# Patient Record
Sex: Female | Born: 1980
Health system: Southern US, Community
[De-identification: ages and names within clinical notes are randomized; demographics above are authoritative.]

## PROBLEM LIST (undated history)

## (undated) ENCOUNTER — Inpatient Hospital Stay (HOSPITAL_COMMUNITY): Payer: Self-pay

## (undated) DIAGNOSIS — D649 Anemia, unspecified: Secondary | ICD-10-CM

## (undated) DIAGNOSIS — R112 Nausea with vomiting, unspecified: Secondary | ICD-10-CM

## (undated) DIAGNOSIS — I499 Cardiac arrhythmia, unspecified: Secondary | ICD-10-CM

## (undated) DIAGNOSIS — T8859XA Other complications of anesthesia, initial encounter: Secondary | ICD-10-CM

## (undated) DIAGNOSIS — O26899 Other specified pregnancy related conditions, unspecified trimester: Secondary | ICD-10-CM

## (undated) DIAGNOSIS — Z789 Other specified health status: Secondary | ICD-10-CM

## (undated) DIAGNOSIS — Z8619 Personal history of other infectious and parasitic diseases: Secondary | ICD-10-CM

## (undated) DIAGNOSIS — T4145XA Adverse effect of unspecified anesthetic, initial encounter: Secondary | ICD-10-CM

## (undated) DIAGNOSIS — Z9889 Other specified postprocedural states: Secondary | ICD-10-CM

## (undated) DIAGNOSIS — M4310 Spondylolisthesis, site unspecified: Secondary | ICD-10-CM

## (undated) DIAGNOSIS — R12 Heartburn: Secondary | ICD-10-CM

## (undated) HISTORY — PX: WISDOM TOOTH EXTRACTION: SHX21

---
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
## 2010-06-28 LAB — RUBELLA ANTIBODY, IGM: Rubella: IMMUNE

## 2010-06-28 LAB — ABO/RH

## 2010-06-28 LAB — RPR: RPR: NONREACTIVE

## 2010-06-28 LAB — HEPATITIS B SURFACE ANTIGEN: Hepatitis B Surface Ag: NEGATIVE

## 2010-10-04 ENCOUNTER — Other Ambulatory Visit: Payer: Self-pay | Admitting: Obstetrics & Gynecology

## 2010-10-05 ENCOUNTER — Encounter (HOSPITAL_COMMUNITY): Payer: Self-pay

## 2010-10-09 ENCOUNTER — Encounter (HOSPITAL_COMMUNITY)
Admission: RE | Admit: 2010-10-09 | Discharge: 2010-10-09 | Disposition: A | Discharge status: Home / Self Care | Payer: BC Managed Care – PPO | Source: Ambulatory Visit | Attending: Obstetrics & Gynecology | Admitting: Obstetrics & Gynecology

## 2010-10-09 ENCOUNTER — Encounter (HOSPITAL_COMMUNITY): Payer: Self-pay

## 2010-10-09 HISTORY — DX: Other specified health status: Z78.9

## 2010-10-09 LAB — RPR: RPR Ser Ql: NONREACTIVE

## 2010-10-09 LAB — SURGICAL PCR SCREEN
MRSA, PCR: NEGATIVE
Staphylococcus aureus: NEGATIVE

## 2010-10-09 LAB — CBC
Platelets: 223 10*3/uL (ref 150–400)
RBC: 3.99 MIL/uL (ref 3.87–5.11)
RDW: 13.1 % (ref 11.5–15.5)
WBC: 7.6 10*3/uL (ref 4.0–10.5)

## 2010-10-09 NOTE — Patient Instructions (Signed)
22 Jenna Potts  10/09/2010   Your procedure is scheduled on:  10/19/10  Report to [facility] at 1030 AM.Friday- desk phone [phone]  Call this number if you have problems the morning of surgery: [phone]   Remember:   Do not eat food:After Midnight.  Do not drink clear liquids: After Midnight.  Take these medicines the morning of surgery with A SIP OF WATER: none   Do not wear jewelry, make-up or nail polish.  Do not wear lotions, powders, or perfumes. You may wear deodorant.  Do not shave 48 hours prior to surgery.  Do not bring valuables to the hospital.  Contacts, dentures or bridgework may not be worn into surgery.  Leave suitcase in the car. After surgery it may be brought to your room.  For patients admitted to the hospital, checkout time is 11:00 AM the day of discharge.   Patients discharged the day of surgery will not be allowed to drive home.  Name and phone number of your driver: Jenna Potts- [phone]  Special Instructions: CHG Shower Use Special Wash: 1/2 bottle night before surgery and 1/2 bottle morning of surgery.   Please read over the following fact sheets that you were given:

## 2010-10-17 NOTE — H&P (Signed)
  30 y.o. G2P1  Estimated Date of Delivery: 10/26/10 admitted at [redacted] weeks gestation for repeat C/S. Prenatal course was uncomplicated. Prenatal labs: Blood Type:A+.  Screening tests for HIV, Syphilis, Hepatitis B, Rubella sensitivity, and gestational diabetes were negative.  Routine urine culture screen positive for GBS.  Afebrile, VSS Heart and Lungs: No active disease Abdomen: soft, gravid, EFW 7.5 lbs. Cervical exam:  closed  Impression: Previous C/S, desires repeat.  Plan:  Repeat LT C/S.

## 2010-10-18 MED ORDER — CEFAZOLIN SODIUM-DEXTROSE 2-3 GM-% IV SOLR
2.0000 g | INTRAVENOUS | Status: AC
  Administered 2010-10-19: 2 g via INTRAVENOUS
  Filled 2010-10-18: qty 50

## 2010-10-19 ENCOUNTER — Inpatient Hospital Stay (HOSPITAL_COMMUNITY): Payer: BC Managed Care – PPO | Admitting: Anesthesiology

## 2010-10-19 ENCOUNTER — Encounter (HOSPITAL_COMMUNITY): Payer: Self-pay | Admitting: *Deleted

## 2010-10-19 ENCOUNTER — Encounter (HOSPITAL_COMMUNITY): Payer: Self-pay | Admitting: Anesthesiology

## 2010-10-19 ENCOUNTER — Inpatient Hospital Stay (HOSPITAL_COMMUNITY)
Admission: RE | Admit: 2010-10-19 | Discharge: 2010-10-21 | DRG: 371 | Disposition: A | Discharge status: Home / Self Care | Payer: BC Managed Care – PPO | Source: Ambulatory Visit | Attending: Obstetrics & Gynecology | Admitting: Obstetrics & Gynecology

## 2010-10-19 ENCOUNTER — Encounter (HOSPITAL_COMMUNITY)
Admission: RE | Disposition: A | Discharge status: Home / Self Care | Payer: Self-pay | Source: Ambulatory Visit | Attending: Obstetrics & Gynecology

## 2010-10-19 ENCOUNTER — Inpatient Hospital Stay (HOSPITAL_COMMUNITY)
Admission: AD | Admit: 2010-10-19 | Payer: BC Managed Care – PPO | Source: Ambulatory Visit | Admitting: Obstetrics & Gynecology

## 2010-10-19 DIAGNOSIS — Z98891 History of uterine scar from previous surgery: Secondary | ICD-10-CM

## 2010-10-19 DIAGNOSIS — Z01818 Encounter for other preprocedural examination: Secondary | ICD-10-CM

## 2010-10-19 DIAGNOSIS — O99892 Other specified diseases and conditions complicating childbirth: Secondary | ICD-10-CM | POA: Diagnosis present

## 2010-10-19 DIAGNOSIS — Z01812 Encounter for preprocedural laboratory examination: Secondary | ICD-10-CM

## 2010-10-19 DIAGNOSIS — Z22330 Carrier of Group B streptococcus: Secondary | ICD-10-CM

## 2010-10-19 DIAGNOSIS — O34219 Maternal care for unspecified type scar from previous cesarean delivery: Principal | ICD-10-CM | POA: Diagnosis present

## 2010-10-19 SURGERY — Surgical Case
Anesthesia: Spinal | Site: Abdomen | Wound class: Clean Contaminated

## 2010-10-19 MED ORDER — LACTATED RINGERS IV SOLN
INTRAVENOUS | Status: DC
  Administered 2010-10-19: 22:00:00 via INTRAVENOUS

## 2010-10-19 MED ORDER — SCOPOLAMINE 1 MG/3DAYS TD PT72
1.0000 | MEDICATED_PATCH | Freq: Once | TRANSDERMAL | Status: DC
  Administered 2010-10-19: 1.5 mg via TRANSDERMAL

## 2010-10-19 MED ORDER — WITCH HAZEL-GLYCERIN EX PADS: 1.0000 "application " | MEDICATED_PAD | CUTANEOUS | Status: DC | PRN

## 2010-10-19 MED ORDER — KETOROLAC TROMETHAMINE 30 MG/ML IJ SOLN: 30.0000 mg | Freq: Four times a day (QID) | INTRAMUSCULAR | Status: AC | PRN

## 2010-10-19 MED ORDER — DIPHENHYDRAMINE HCL 50 MG/ML IJ SOLN: 12.5000 mg | INTRAMUSCULAR | Status: DC | PRN

## 2010-10-19 MED ORDER — NALBUPHINE SYRINGE 5 MG/0.5 ML
5.0000 mg | INJECTION | INTRAMUSCULAR | Status: DC | PRN
  Filled 2010-10-19: qty 1

## 2010-10-19 MED ORDER — DIBUCAINE 1 % RE OINT: 1.0000 "application " | TOPICAL_OINTMENT | RECTAL | Status: DC | PRN

## 2010-10-19 MED ORDER — KETOROLAC TROMETHAMINE 60 MG/2ML IM SOLN
60.0000 mg | Freq: Once | INTRAMUSCULAR | Status: AC | PRN
  Administered 2010-10-19: 60 mg via INTRAMUSCULAR

## 2010-10-19 MED ORDER — MORPHINE SULFATE (PF) 0.5 MG/ML IJ SOLN
INTRAMUSCULAR | Status: DC | PRN
  Administered 2010-10-19: .1 mg via INTRATHECAL

## 2010-10-19 MED ORDER — SIMETHICONE 80 MG PO CHEW: 80.0000 mg | CHEWABLE_TABLET | ORAL | Status: DC | PRN

## 2010-10-19 MED ORDER — MEPERIDINE HCL 25 MG/ML IJ SOLN: 6.2500 mg | INTRAMUSCULAR | Status: DC | PRN

## 2010-10-19 MED ORDER — PRENATAL PLUS 27-1 MG PO TABS
1.0000 | ORAL_TABLET | Freq: Every day | ORAL | Status: DC
  Administered 2010-10-20 – 2010-10-21 (×2): 1 via ORAL
  Filled 2010-10-19 (×2): qty 1

## 2010-10-19 MED ORDER — ONDANSETRON HCL 4 MG/2ML IJ SOLN: 4.0000 mg | Freq: Three times a day (TID) | INTRAMUSCULAR | Status: DC | PRN

## 2010-10-19 MED ORDER — OXYTOCIN 20 UNITS IN LACTATED RINGERS INFUSION - SIMPLE
INTRAVENOUS | Status: DC | PRN
  Administered 2010-10-19: 20 [IU] via INTRAVENOUS

## 2010-10-19 MED ORDER — ONDANSETRON HCL 4 MG/2ML IJ SOLN
INTRAMUSCULAR | Status: AC
  Filled 2010-10-19: qty 2

## 2010-10-19 MED ORDER — FENTANYL CITRATE 0.05 MG/ML IJ SOLN
INTRAMUSCULAR | Status: DC | PRN
  Administered 2010-10-19: 15 ug via INTRATHECAL

## 2010-10-19 MED ORDER — ZOLPIDEM TARTRATE 5 MG PO TABS: 5.0000 mg | ORAL_TABLET | Freq: Every evening | ORAL | Status: DC | PRN

## 2010-10-19 MED ORDER — LANOLIN HYDROUS EX OINT: 1.0000 "application " | TOPICAL_OINTMENT | CUTANEOUS | Status: DC | PRN

## 2010-10-19 MED ORDER — LACTATED RINGERS IV SOLN
Freq: Once | INTRAVENOUS | Status: AC
  Administered 2010-10-19: 11:00:00 via INTRAVENOUS

## 2010-10-19 MED ORDER — ONDANSETRON HCL 4 MG/2ML IJ SOLN: 4.0000 mg | INTRAMUSCULAR | Status: DC | PRN

## 2010-10-19 MED ORDER — METOCLOPRAMIDE HCL 5 MG/ML IJ SOLN: 10.0000 mg | Freq: Three times a day (TID) | INTRAMUSCULAR | Status: DC | PRN

## 2010-10-19 MED ORDER — DIPHENHYDRAMINE HCL 25 MG PO CAPS: 25.0000 mg | ORAL_CAPSULE | Freq: Four times a day (QID) | ORAL | Status: DC | PRN

## 2010-10-19 MED ORDER — SIMETHICONE 80 MG PO CHEW
80.0000 mg | CHEWABLE_TABLET | Freq: Three times a day (TID) | ORAL | Status: DC
  Administered 2010-10-20 – 2010-10-21 (×6): 80 mg via ORAL

## 2010-10-19 MED ORDER — HYDROMORPHONE HCL 1 MG/ML IJ SOLN
0.2500 mg | INTRAMUSCULAR | Status: DC | PRN
  Administered 2010-10-19 (×3): 0.25 mg via INTRAVENOUS

## 2010-10-19 MED ORDER — DIPHENHYDRAMINE HCL 25 MG PO CAPS: 25.0000 mg | ORAL_CAPSULE | ORAL | Status: DC | PRN

## 2010-10-19 MED ORDER — HYDROMORPHONE HCL 1 MG/ML IJ SOLN
INTRAMUSCULAR | Status: AC
  Administered 2010-10-19: 0.25 mg via INTRAVENOUS
  Filled 2010-10-19: qty 1

## 2010-10-19 MED ORDER — ONDANSETRON HCL 4 MG PO TABS: 4.0000 mg | ORAL_TABLET | ORAL | Status: DC | PRN

## 2010-10-19 MED ORDER — PHENYLEPHRINE HCL 10 MG/ML IJ SOLN
INTRAMUSCULAR | Status: DC | PRN
  Administered 2010-10-19 (×3): 40 ug via INTRAVENOUS
  Administered 2010-10-19: 80 ug via INTRAVENOUS

## 2010-10-19 MED ORDER — OXYTOCIN 20 UNITS IN LACTATED RINGERS INFUSION - SIMPLE
INTRAVENOUS | Status: AC
  Filled 2010-10-19: qty 1000

## 2010-10-19 MED ORDER — MORPHINE SULFATE 0.5 MG/ML IJ SOLN
INTRAMUSCULAR | Status: AC
  Filled 2010-10-19: qty 10

## 2010-10-19 MED ORDER — OXYTOCIN 20 UNITS IN LACTATED RINGERS INFUSION - SIMPLE
125.0000 mL/h | INTRAVENOUS | Status: AC
  Administered 2010-10-19: 125 mL/h via INTRAVENOUS

## 2010-10-19 MED ORDER — OXYTOCIN 10 UNIT/ML IJ SOLN
INTRAMUSCULAR | Status: AC
  Filled 2010-10-19: qty 2

## 2010-10-19 MED ORDER — SODIUM CHLORIDE 0.9 % IJ SOLN: 3.0000 mL | INTRAMUSCULAR | Status: DC | PRN

## 2010-10-19 MED ORDER — KETOROLAC TROMETHAMINE 30 MG/ML IJ SOLN
30.0000 mg | Freq: Four times a day (QID) | INTRAMUSCULAR | Status: AC | PRN
  Administered 2010-10-19: 30 mg via INTRAVENOUS
  Filled 2010-10-19: qty 1

## 2010-10-19 MED ORDER — TETANUS-DIPHTH-ACELL PERTUSSIS 5-2.5-18.5 LF-MCG/0.5 IM SUSP
0.5000 mL | Freq: Once | INTRAMUSCULAR | Status: AC
  Administered 2010-10-20: 0.5 mL via INTRAMUSCULAR
  Filled 2010-10-19: qty 0.5

## 2010-10-19 MED ORDER — SODIUM CHLORIDE 0.9 % IV SOLN
1.0000 ug/kg/h | INTRAVENOUS | Status: DC | PRN
  Filled 2010-10-19: qty 2.5

## 2010-10-19 MED ORDER — MENTHOL 3 MG MT LOZG: 1.0000 | LOZENGE | OROMUCOSAL | Status: DC | PRN

## 2010-10-19 MED ORDER — SCOPOLAMINE 1 MG/3DAYS TD PT72
MEDICATED_PATCH | TRANSDERMAL | Status: AC
  Filled 2010-10-19: qty 1

## 2010-10-19 MED ORDER — EPHEDRINE SULFATE 50 MG/ML IJ SOLN
INTRAMUSCULAR | Status: DC | PRN
  Administered 2010-10-19 (×2): 15 mg via INTRAVENOUS
  Administered 2010-10-19: 20 mg via INTRAVENOUS

## 2010-10-19 MED ORDER — IBUPROFEN 600 MG PO TABS
600.0000 mg | ORAL_TABLET | Freq: Four times a day (QID) | ORAL | Status: DC
  Administered 2010-10-20 – 2010-10-21 (×6): 600 mg via ORAL
  Filled 2010-10-19 (×2): qty 1

## 2010-10-19 MED ORDER — NALOXONE HCL 0.4 MG/ML IJ SOLN: 0.4000 mg | INTRAMUSCULAR | Status: DC | PRN

## 2010-10-19 MED ORDER — SENNOSIDES-DOCUSATE SODIUM 8.6-50 MG PO TABS
2.0000 | ORAL_TABLET | Freq: Every day | ORAL | Status: DC
  Administered 2010-10-20: 2 via ORAL

## 2010-10-19 MED ORDER — DIPHENHYDRAMINE HCL 50 MG/ML IJ SOLN: 25.0000 mg | INTRAMUSCULAR | Status: DC | PRN

## 2010-10-19 MED ORDER — BUPIVACAINE IN DEXTROSE 0.75-8.25 % IT SOLN
INTRATHECAL | Status: DC | PRN
  Administered 2010-10-19: 1.5 mg via INTRATHECAL

## 2010-10-19 MED ORDER — OXYCODONE-ACETAMINOPHEN 5-325 MG PO TABS
1.0000 | ORAL_TABLET | ORAL | Status: DC | PRN
  Administered 2010-10-20 – 2010-10-21 (×7): 1 via ORAL
  Filled 2010-10-19 (×7): qty 1

## 2010-10-19 MED ORDER — IBUPROFEN 600 MG PO TABS
600.0000 mg | ORAL_TABLET | Freq: Four times a day (QID) | ORAL | Status: DC | PRN
  Filled 2010-10-19 (×4): qty 1

## 2010-10-19 MED ORDER — LACTATED RINGERS IV SOLN
INTRAVENOUS | Status: DC
  Administered 2010-10-19 (×4): via INTRAVENOUS

## 2010-10-19 MED ORDER — ONDANSETRON HCL 4 MG/2ML IJ SOLN
INTRAMUSCULAR | Status: DC | PRN
  Administered 2010-10-19: 4 mg via INTRAVENOUS

## 2010-10-19 MED ORDER — FENTANYL CITRATE 0.05 MG/ML IJ SOLN
INTRAMUSCULAR | Status: AC
  Filled 2010-10-19: qty 2

## 2010-10-19 MED ORDER — KETOROLAC TROMETHAMINE 60 MG/2ML IM SOLN
INTRAMUSCULAR | Status: AC
  Administered 2010-10-19: 60 mg via INTRAMUSCULAR
  Filled 2010-10-19: qty 2

## 2010-10-19 SURGICAL SUPPLY — 29 items
CLOTH BEACON ORANGE TIMEOUT ST (SAFETY) ×2 IMPLANT
CONTAINER PREFILL 10% NBF 15ML (MISCELLANEOUS) IMPLANT
ELECT REM PT RETURN 9FT ADLT (ELECTROSURGICAL) ×2
ELECTRODE REM PT RTRN 9FT ADLT (ELECTROSURGICAL) ×1 IMPLANT
EXTRACTOR VACUUM M CUP 4 TUBE (SUCTIONS) ×2 IMPLANT
GLOVE ECLIPSE 6.0 STRL STRAW (GLOVE) ×2 IMPLANT
GLOVE ECLIPSE 6.5 STRL STRAW (GLOVE) ×2 IMPLANT
GOWN PREVENTION PLUS LG XLONG (DISPOSABLE) ×6 IMPLANT
KIT ABG SYR 3ML LUER SLIP (SYRINGE) IMPLANT
NEEDLE HYPO 25X5/8 SAFETYGLIDE (NEEDLE) ×2 IMPLANT
NS IRRIG 1000ML POUR BTL (IV SOLUTION) ×2 IMPLANT
PACK C SECTION WH (CUSTOM PROCEDURE TRAY) ×2 IMPLANT
RTRCTR C-SECT PINK 25CM LRG (MISCELLANEOUS) IMPLANT
SLEEVE SCD COMPRESS KNEE MED (MISCELLANEOUS) IMPLANT
STAPLER VISISTAT 35W (STAPLE) IMPLANT
SUT PLAIN 0 NONE (SUTURE) IMPLANT
SUT VIC AB 0 CT1 27 (SUTURE) ×3
SUT VIC AB 0 CT1 27XBRD ANBCTR (SUTURE) ×3 IMPLANT
SUT VIC AB 1 CTX 36 (SUTURE) ×2
SUT VIC AB 1 CTX36XBRD ANBCTRL (SUTURE) ×2 IMPLANT
SUT VIC AB 3-0 CT1 27 (SUTURE) ×1
SUT VIC AB 3-0 CT1 TAPERPNT 27 (SUTURE) ×1 IMPLANT
SUT VIC AB 3-0 PS2 18 (SUTURE)
SUT VIC AB 3-0 PS2 18XBRD (SUTURE) IMPLANT
SUT VIC AB 3-0 SH 27 (SUTURE)
SUT VIC AB 3-0 SH 27X BRD (SUTURE) IMPLANT
TOWEL OR 17X24 6PK STRL BLUE (TOWEL DISPOSABLE) ×4 IMPLANT
TRAY FOLEY CATH 14FR (SET/KITS/TRAYS/PACK) ×2 IMPLANT
WATER STERILE IRR 1000ML POUR (IV SOLUTION) ×2 IMPLANT

## 2010-10-19 NOTE — Anesthesia Postprocedure Evaluation (Signed)
Anesthesia Post Note  Patient: Jenna Potts  Procedure(s) Performed:  CESAREAN SECTION  Anesthesia type: Spinal  Patient location: PACU  Post pain: Pain level controlled  Post assessment: Post-op Vital signs reviewed  Post vital signs: Reviewed  Level of consciousness: awake  Complications: No apparent anesthesia complications

## 2010-10-19 NOTE — Op Note (Signed)
  Patient Name: Sevannah Madia MRN: [id number]  Date of Surgery: 10/19/2010    PREOPERATIVE DIAGNOSIS: repeat cesarean  section  POSTOPERATIVE DIAGNOSIS: repeat cesarean  section   PROCEDURE: Low transverse cesarean section  SURGEON: Caralyn Guile. Arlyce Dice M.D.  ASSISTANT: Conley Simmonds, M.D.  ANESTHESIA: Spinal  ESTIMATED BLOOD LOSS: 800 ml  FINDINGS: 12:30 pm, Female, 8 lbs, Apgar 9/9, clear fluid, normal adnexa and uterus   INDICATIONS: This is a 30 y.o.  [place] 1 who is admitted for repeat cesarean section at 39 weeks at patient's request.  PROCEDURE IN DETAIL: The patient was taken to the operating room and spinal anesthesia was placed.  She was then placed in the supine position with left lateral displacement of the uterus. The abdomen was prepped and draped in a sterile fashion and the bladder was catheterized.  A low transverse abdominal incision was made and carried down to the fascia. The fascia was opened transversely and the rectus sheath was dissected from the underlying rectus muscle. The rectus midline was identified and opened by sharp and blunt dissection. The peritoneum was opened. An Alexis retractor was placed and the lower uterine segment was identified, entered transversely by careful sharp dissection, and extended bluntly.  The infant was delivered with the aid of a vacuum extractor. The placenta was donated by the mother for medical use. The uterus was bluntly curettage. The lower segment was closed in a single layer with running interlocking Vicryl 1 suture.  The peritoneum and rectus muscle were closed in the midline with running 3-0 Vicryl suture. The fascia was closed with running 0 Vicryl suture and the skin was closed with staples. All sponge and instrument counts were correct.  The patient tolerated the procedure well and left the operating room in good condition.

## 2010-10-19 NOTE — Transfer of Care (Signed)
Immediate Anesthesia Transfer of Care Note  Patient: Jenna Potts  Procedure(s) Performed:  CESAREAN SECTION  Patient Location: PACU  Anesthesia Type: Spinal  Level of Consciousness: awake, alert  and oriented  Airway & Oxygen Therapy: Patient Spontanous Breathing  Post-op Assessment: Report given to PACU RN and Post -op Vital signs reviewed and stable  Post vital signs: Reviewed and stable  Complications: No apparent anesthesia complications

## 2010-10-19 NOTE — Anesthesia Preprocedure Evaluation (Signed)
Anesthesia Evaluation  Name, MR# and DOB Patient awake  General Assessment Comment  Reviewed: Allergy & Precautions, H&P , NPO status , Patient's Chart, lab work & pertinent test results, reviewed documented beta blocker date and time   History of Anesthesia Complications (+) PONV  Airway Mallampati: I TM Distance: >3 FB Neck ROM: full    Dental  (+) Teeth Intact   Pulmonary  clear to auscultation  breath sounds clear to auscultation none    Cardiovascular regular Normal    Neuro/Psych Negative Neurological ROS  Negative Psych ROS  GI/Hepatic/Renal negative GI ROS  negative Liver ROS  negative Renal ROS        Endo/Other  Negative Endocrine ROS (+)      Abdominal   Musculoskeletal   Hematology negative hematology ROS (+)   Peds  Reproductive/Obstetrics (+) Pregnancy    Anesthesia Other Findings             Anesthesia Physical Anesthesia Plan  ASA: II  Anesthesia Plan: Epidural and Spinal   Post-op Pain Management:    Induction:   Airway Management Planned:   Additional Equipment:   Intra-op Plan:   Post-operative Plan:   Informed Consent: I have reviewed the patients History and Physical, chart, labs and discussed the procedure including the risks, benefits and alternatives for the proposed anesthesia with the patient or authorized representative who has indicated his/her understanding and acceptance.     Plan Discussed with: CRNA and Surgeon  Anesthesia Plan Comments:         Anesthesia Quick Evaluation

## 2010-10-19 NOTE — Anesthesia Procedure Notes (Addendum)
Spinal Block  Patient location during procedure: OR Start time: 10/19/2010 12:14 PM Staffing Performed by: anesthesiologist  Preanesthetic Checklist Completed: patient identified, site marked, surgical consent, pre-op evaluation, timeout performed, IV checked, risks and benefits discussed and monitors and equipment checked Spinal Block Patient position: sitting Prep: site prepped and draped and DuraPrep Patient monitoring: heart rate, cardiac monitor, continuous pulse ox and blood pressure Approach: midline Location: L3-4 Injection technique: single-shot Needle Needle type: Sprotte  Needle gauge: 24 G Needle length: 9 cm Assessment Sensory level: T4 Additional Notes Clear free flow CSF on 1st attempt.

## 2010-10-19 NOTE — Consult Note (Signed)
Neonatology Note:   Attendance at C-section:    I was asked to attend this repeat C/S at term. The mother is a G2P1 A pos, GBS pos with an uncomplicated pregnancy. ROM at delivery, fluid bloody. Vacuum-assisted delivery, infant cyanotic but with good spontaneous cry and tone. Needed no suctioning. Ap 8/9. Lungs clear to ausc in DR. To CN to care of Pediatrician.   Deatra James, MD

## 2010-10-20 LAB — CBC
Hemoglobin: 9.5 g/dL — ABNORMAL LOW (ref 12.0–15.0)
MCH: 30 pg (ref 26.0–34.0)
RBC: 3.17 MIL/uL — ABNORMAL LOW (ref 3.87–5.11)
WBC: 9.2 10*3/uL (ref 4.0–10.5)

## 2010-10-20 NOTE — Progress Notes (Signed)
Subjective: Postpartum Day 1: Cesarean Delivery Patient reports incisional pain, tolerating PO and + flatus.  Pain controlled with oral pain medications  Objective: Vital signs in last 24 hours: Temp:  [97.4 F (36.3 C)-98.8 F (37.1 C)] 98.3 F (36.8 C) (09/08 0408) Pulse Rate:  [58-75] 63  (09/08 0408) Resp:  [15-20] 20  (09/08 0408) BP: (87-118)/(51-73) 102/54 mmHg (09/08 0408) SpO2:  [95 %-98 %] 96 % (09/08 0408) Weight:  [62.596 kg (138 lb)] 138 lb (62.596 kg) (09/07 1555)  Physical Exam:  General: alert, cooperative, appears stated age and no distress Lochia: appropriate Uterine Fundus: firm Incision: dressing clean/dry/intact DVT Evaluation: No evidence of DVT seen on physical exam.   Basename 10/20/10 0545  HGB 9.5*  HCT 28.4*    Assessment/Plan: Status post Cesarean section. Doing well postoperatively.  Continue current care. Patient desires neonatal circumcision. R/B/A reviewed. Patient consents to proceed with procedure Hunner Garcon H. 10/20/2010, 9:55 AM

## 2010-10-20 NOTE — Progress Notes (Signed)
Encounter addended by: Edison Pace, CRNA on: 10/20/2010  8:50 AM<BR>     Documentation filed: Notes Section, Charges VN

## 2010-10-20 NOTE — Anesthesia Postprocedure Evaluation (Signed)
  Anesthesia Post-op Note  Patient: Jenna Potts  Procedure(s) Performed:  CESAREAN SECTION  Patient Location: PACU and Mother/Baby  Anesthesia Type: Spinal  Level of Consciousness: awake, alert  and oriented  Airway and Oxygen Therapy: Patient Spontanous Breathing  Post-op Assessment: Post-op Vital signs reviewed  Post-op Vital Signs: Reviewed and stable  Complications: No apparent anesthesia complications

## 2010-10-21 MED ORDER — OXYCODONE-ACETAMINOPHEN 5-325 MG PO TABS: 2.0000 | ORAL_TABLET | ORAL | Status: AC | PRN

## 2010-10-21 MED ORDER — IBUPROFEN 600 MG PO TABS: 600.0000 mg | ORAL_TABLET | Freq: Four times a day (QID) | ORAL | Status: AC | PRN

## 2010-10-21 NOTE — Discharge Summary (Signed)
Obstetric Discharge Summary Reason for Admission: cesarean section Prenatal Procedures: ultrasound Intrapartum Procedures: cesarean: low cervical, transverse Postpartum Procedures: none Complications-Operative and Postpartum: none Hemoglobin  Date Value Range Status  10/20/2010 9.5* 12.0-15.0 (g/dL) Final     HCT  Date Value Range Status  10/20/2010 28.4* 36.0-46.0 (%) Final    Discharge Diagnoses: Term Pregnancy-delivered  Discharge Information: Date: 10/21/2010 Activity: pelvic rest Diet: routine Medications: Ibuprophen and Percocet Condition: stable Instructions: refer to practice specific booklet Discharge to: home Follow-up Information    Follow up with [facility] in 4 weeks. (for postpartum evaluation)    Contact information:   [address] [place] [address] [phone]          Newborn Data: Live born female  Birth Weight: 8 lb 0.4 oz (3640 g) APGAR: 8, 9  Home with mother Infant s/p neonatal circumcision.  Naw Lasala H. 10/21/2010, 12:16 PM

## 2010-10-21 NOTE — Progress Notes (Signed)
Subjective: Postpartum Day 2: Cesarean Delivery Patient reports tolerating PO, + flatus and no problems voiding.  Patient is doing well and considering early discharge home today.  Objective: Vital signs in last 24 hours: Temp:  [98.2 F (36.8 C)-98.5 F (36.9 C)] 98.5 F (36.9 C) (09/09 0545) Pulse Rate:  [64-66] 64  (09/09 0545) Resp:  [18] 18  (09/09 0545) BP: (100-104)/(66) 104/66 mmHg (09/09 0545)  Physical Exam:  General: alert, cooperative, appears stated age and no distress Lochia: appropriate Uterine Fundus: firm Incision: healing well DVT Evaluation: No evidence of DVT seen on physical exam.   Basename 10/20/10 0545  HGB 9.5*  HCT 28.4*    Assessment/Plan: Status post Cesarean section. Doing well postoperatively.  Discharge home with standard precautions and return to clinic in 4-6 weeks. If peds doesn't release baby then will d/c home tomorrow  Suzy Kugel H. 10/21/2010, 11:23 AM

## 2010-10-24 ENCOUNTER — Encounter (HOSPITAL_COMMUNITY): Payer: Self-pay | Admitting: Obstetrics & Gynecology

## 2011-01-14 ENCOUNTER — Ambulatory Visit: Payer: BC Managed Care – PPO | Admitting: Internal Medicine

## 2013-12-13 ENCOUNTER — Encounter (HOSPITAL_COMMUNITY): Payer: Self-pay | Admitting: Obstetrics & Gynecology

## 2014-09-05 LAB — OB RESULTS CONSOLE HEPATITIS B SURFACE ANTIGEN: HEP B S AG: NEGATIVE

## 2014-09-05 LAB — OB RESULTS CONSOLE HIV ANTIBODY (ROUTINE TESTING): HIV: NONREACTIVE

## 2014-09-05 LAB — OB RESULTS CONSOLE ABO/RH: RH Type: POSITIVE

## 2014-09-05 LAB — OB RESULTS CONSOLE RUBELLA ANTIBODY, IGM: RUBELLA: IMMUNE

## 2014-09-05 LAB — OB RESULTS CONSOLE GC/CHLAMYDIA
CHLAMYDIA, DNA PROBE: NEGATIVE
GC PROBE AMP, GENITAL: NEGATIVE

## 2014-09-05 LAB — OB RESULTS CONSOLE ANTIBODY SCREEN: ANTIBODY SCREEN: NEGATIVE

## 2014-09-05 LAB — OB RESULTS CONSOLE RPR: RPR: NONREACTIVE

## 2014-09-08 ENCOUNTER — Inpatient Hospital Stay (HOSPITAL_COMMUNITY): Payer: BC Managed Care – PPO

## 2014-09-08 ENCOUNTER — Encounter (HOSPITAL_COMMUNITY): Payer: Self-pay | Admitting: *Deleted

## 2014-09-08 ENCOUNTER — Inpatient Hospital Stay (HOSPITAL_COMMUNITY)
Admission: AD | Admit: 2014-09-08 | Discharge: 2014-09-08 | Disposition: A | Discharge status: Home / Self Care | Payer: BC Managed Care – PPO | Source: Ambulatory Visit | Attending: Obstetrics and Gynecology | Admitting: Obstetrics and Gynecology

## 2014-09-08 DIAGNOSIS — Z3A08 8 weeks gestation of pregnancy: Secondary | ICD-10-CM | POA: Insufficient documentation

## 2014-09-08 DIAGNOSIS — R102 Pelvic and perineal pain: Secondary | ICD-10-CM | POA: Diagnosis present

## 2014-09-08 DIAGNOSIS — O208 Other hemorrhage in early pregnancy: Secondary | ICD-10-CM | POA: Insufficient documentation

## 2014-09-08 DIAGNOSIS — R109 Unspecified abdominal pain: Secondary | ICD-10-CM

## 2014-09-08 LAB — CBC
HEMATOCRIT: 37.5 % (ref 36.0–46.0)
HEMOGLOBIN: 12.7 g/dL (ref 12.0–15.0)
MCH: 29.5 pg (ref 26.0–34.0)
MCHC: 33.9 g/dL (ref 30.0–36.0)
MCV: 87.2 fL (ref 78.0–100.0)
PLATELETS: 247 10*3/uL (ref 150–400)
RBC: 4.3 MIL/uL (ref 3.87–5.11)
RDW: 13.8 % (ref 11.5–15.5)
WBC: 9.4 10*3/uL (ref 4.0–10.5)

## 2014-09-08 LAB — HCG, SERUM, QUALITATIVE: Preg, Serum: POSITIVE — AB

## 2014-09-08 LAB — HCG, QUANTITATIVE, PREGNANCY: hCG, Beta Chain, Quant, S: 141033 m[IU]/mL — ABNORMAL HIGH (ref <5)

## 2014-09-08 NOTE — MAU Provider Note (Signed)
Jenna Potts is a 34 y.o. G3P2 at 8.0 weeks came from the office with right side pain   History     There are no active problems to display for this patient.   Chief Complaint  Patient presents with  . Abdominal Pain   HPI  OB History    Gravida Para Term Preterm AB TAB SAB Ectopic Multiple Living   3 2 2       2       Past Medical History  Diagnosis Date  . No pertinent past medical history     Past Surgical History  Procedure Laterality Date  . Cesarean section  2009  . Cesarean section  10/19/2010    Procedure: CESAREAN SECTION;  Surgeon: Mickel Baas;  Location: [facility];  Service: Gynecology;  Laterality: N/A;    History reviewed. No pertinent family history.  History  Substance Use Topics  . Smoking status: Never Smoker   . Smokeless tobacco: Not on file  . Alcohol Use: No    Allergies: No Known Allergies  Prescriptions prior to admission  Medication Sig Dispense Refill Last Dose  . metroNIDAZOLE (FLAGYL) 500 MG tablet Take 500 mg by mouth 2 (two) times daily.   09/08/2014 at Unknown time  . penicillin v potassium (VEETID) 500 MG tablet Take 500 mg by mouth 3 (three) times daily. Patient picked up prescription on 09-06-14   09/08/2014 at Unknown time  . prenatal vitamin w/FE, FA (PRENATAL 1 + 1) 27-1 MG TABS Take 1 tablet by mouth at bedtime.     09/08/2014 at Unknown time    ROS See HPI above, all other systems are negative  Physical Exam   Blood pressure 113/64, pulse 64, temperature 98.9 F (37.2 C), temperature source Oral, resp. rate 16, height 5' 0.5" (1.537 m), weight 120 lb 6.4 oz (54.613 kg), unknown if currently breastfeeding.  Physical Exam Ext:  WNL ABD: Soft, non tender to palpation, no rebound or guarding SVE: deferred   ED Course  Assessment: IUP at  8.0 weeks Membranes: intact FHR n/a CTX:     Plan:  Labs: CBC, quant [place] to rule out ectopic    [employer], CNM, MSN 09/08/2014. 2:17 PM   MAU Addendum Note  1415  Results for orders placed or performed during the hospital encounter of 09/08/14 (from the past 24 hour(s))  CBC     Status: None   Collection Time: 09/08/14 11:50 AM  Result Value Ref Range   WBC 9.4 4.0 - 10.5 K/uL   RBC 4.30 3.87 - 5.11 MIL/uL   Hemoglobin 12.7 12.0 - 15.0 g/dL   HCT 16.1 09.6 - 04.5 %   MCV 87.2 78.0 - 100.0 fL   MCH 29.5 26.0 - 34.0 pg   MCHC 33.9 30.0 - 36.0 g/dL   RDW 40.9 81.1 - 91.4 %   Platelets 247 150 - 400 K/uL  hCG, serum, qualitative     Status: Abnormal   Collection Time: 09/08/14 11:50 AM  Result Value Ref Range   Preg, Serum POSITIVE (A) NEGATIVE  hCG, quantitative, pregnancy     Status: Abnormal   Collection Time: 09/08/14 11:50 AM  Result Value Ref Range   hCG, Beta Chain, Quant, S 141033 (H) <5 mIU/mL   NW:GNFAOZ live untrauterine embro, FHR 160, yolk sac and embro present, small to moderate SQH   Plan: -Discussed need to follow up in office  -Bleeding and PTL Precautions -Encouraged to call if any questions or concerns arise  prior to next scheduled office visit august 9th -Discharged to home in stable condition   Jenna Potts, CNM, MSN 09/08/2014. 2:17 PM

## 2014-09-08 NOTE — MAU Note (Signed)
Has been having pain for about a wk, started as cramping..  On rx for UTI, started 3 days ago. Flu like symptoms are gone, still having sharp pain in [facility]

## 2015-02-12 DIAGNOSIS — D649 Anemia, unspecified: Secondary | ICD-10-CM

## 2015-02-12 HISTORY — DX: Anemia, unspecified: D64.9

## 2015-03-20 ENCOUNTER — Other Ambulatory Visit: Payer: Self-pay | Admitting: Obstetrics and Gynecology

## 2015-04-09 NOTE — H&P (Signed)
Jenna Potts is a 35 y.o. female, G3P2002  at 39.1 weeks, presenting for repeat cesarean section due to previous cesarean sections.  Primary cesarean section occurred  in 2009  At [facility] for failure to progress and repeat cesarean section in 2012.  There are no active problems to display for this patient. GBS carrier Anemia during pregnancy History of Cesarean section x 2    History of present pregnancy: Patient entered care at 9.5 weeks.   EDC of 04/20/15 was established by LMP of 07/14/14.    Anatomy scan:  19.4 weeks on 11/28/14, with normal findings and an posterior placenta.   Fetus is breech. EFW :316  grams, 52% MVP : 325cm , appears normal. Placenta is posterior. Placental tip to  os measures 4.2 cm. Cervix  appears closed. No previa seen . Right adnexa appears normal. Left ovary and  adnexa appear normal.  Additional [place] evaluations:  14.4 wks  viability on 10/24/14 anteverted uterus, SIUP, placenta appears posterior, ovaries and adenexas are  unremarkable  Significant prenatal events:  First trimester:  GBS carrier,  Second trimester:  Round ligament pain, declines genetic testing, originally undecided about TOLAC,   VBAC calculator 60%, ultimately decided on repeat cesarean Third trimester:  Anemia, started on Fe; UTI, Rx with Abx  Last evaluation:  38.1 wks on 04/07/15 by V. Emilee Hero, CNM    FH 37cm, Vertex, FHT 150 bpm,  BP 104/74,  wgt 152.5lbs   OB History    Gravida Para Term Preterm AB TAB SAB Ectopic Multiple Living   12/31/2007  41.6 wks Female  8lbs 3oz  LTCS  Due failure to progress 10/19/2010    39 wks  Female 8lbs  LTCS    Note: Pt has 1 adopted child  Past Medical History  Diagnosis Date  . No pertinent past medical history    Past Surgical History  Procedure Laterality Date  . Cesarean section  2009  . Cesarean section  10/19/2010    Procedure: CESAREAN SECTION;  Surgeon: Mickel Baas;  Location: [facility];  Service:  Gynecology;  Laterality: N/A;   Family History: family history is not on file. Social History:  reports that she has never smoked. She does not have any smokeless tobacco history on file. She reports that she does not drink alcohol or use illicit drugs.  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  Chest clear Heart RRR without murmur Abd gravid, NT, FH 38 Ext: wnl  FHR: Category : 150 bpm UCs:  none  Prenatal labs: ABO, Rh:     A, positive   09/05/14 Antibody:      Negative   09/05/14 Rubella:  !Error!   Immune  RPR:      NR     09/05/14, 01/23/15 HBsAg:     Negative    09/05/14 HIV:     NR     09/05/14 GBS:  Positive  Sickle cell/Hgb electrophoresis:  N/A Pap:  wnl 07/06/14 GC:  Negative 09/01/14 Chlamydia:  Negative 09/01/14 Genetic screenings:  declined Glucola:  wnl  01/23/15 Other:   Hgb 12.8 at NOB, 10.7 at 28 weeks    Assessment/Plan: IUP at 39.1 Anemia Hx of Previous Low transverse cesarean section x2 GBS carrier  Plan: Admit to Birthing Suite per consult with Dr. Su Hilt Scheduled Repeat Low Transverse Cesarean Section Routine [facility] orders   Beatrix Fetters, MSN 04/09/2015, 1:04 PM

## 2015-04-11 ENCOUNTER — Encounter (HOSPITAL_COMMUNITY): Payer: Self-pay

## 2015-04-11 NOTE — Patient Instructions (Signed)
Your procedure is scheduled on:  Friday, April 14, 2015  Enter through the [employer] [facility] at:  9:00 AM  Pick up the phone at the desk and dial [phone].  Call this number if you have problems the morning of surgery: [phone].  Remember:.  Do NOT eat food or drink after:  Midnight Thursday  Take these medicines the morning of surgery with a SIP OF WATER:  None  Do NOT wear jewelry (body piercing), metal hair clips/bobby pins, or nail polish. Do NOT wear lotions, powders, or perfumes.  You may wear deoderant. Do NOT shave for 48 hours prior to surgery. Do NOT bring valuables to the hospital.  Leave suitcase in car.  After surgery it may be brought to your room.  For patients admitted to the hospital, checkout time is 11:00 AM the day of discharge.

## 2015-04-12 ENCOUNTER — Encounter (HOSPITAL_COMMUNITY): Payer: Self-pay

## 2015-04-12 ENCOUNTER — Encounter (HOSPITAL_COMMUNITY)
Admission: RE | Admit: 2015-04-12 | Discharge: 2015-04-12 | Disposition: A | Discharge status: Home / Self Care | Payer: 59 | Source: Ambulatory Visit | Attending: Obstetrics and Gynecology | Admitting: Obstetrics and Gynecology

## 2015-04-12 HISTORY — DX: Spondylolisthesis, site unspecified: M43.10

## 2015-04-12 HISTORY — DX: Anemia, unspecified: D64.9

## 2015-04-12 HISTORY — DX: Other specified pregnancy related conditions, unspecified trimester: O26.899

## 2015-04-12 HISTORY — DX: Heartburn: R12

## 2015-04-12 HISTORY — DX: Cardiac arrhythmia, unspecified: I49.9

## 2015-04-12 LAB — CBC
HCT: 34.3 % — ABNORMAL LOW (ref 36.0–46.0)
HEMOGLOBIN: 11.3 g/dL — AB (ref 12.0–15.0)
MCH: 28.7 pg (ref 26.0–34.0)
MCHC: 32.9 g/dL (ref 30.0–36.0)
MCV: 87.1 fL (ref 78.0–100.0)
Platelets: 234 10*3/uL (ref 150–400)
RBC: 3.94 MIL/uL (ref 3.87–5.11)
RDW: 14.8 % (ref 11.5–15.5)
WBC: 6.4 10*3/uL (ref 4.0–10.5)

## 2015-04-12 LAB — TYPE AND SCREEN
ABO/RH(D): A POS
Antibody Screen: NEGATIVE

## 2015-04-12 LAB — ABO/RH: ABO/RH(D): A POS

## 2015-04-13 LAB — RPR: RPR: NONREACTIVE

## 2015-04-14 ENCOUNTER — Inpatient Hospital Stay (HOSPITAL_COMMUNITY): Payer: 59 | Admitting: Anesthesiology

## 2015-04-14 ENCOUNTER — Encounter (HOSPITAL_COMMUNITY)
Admission: RE | Disposition: A | Discharge status: Home / Self Care | Payer: Self-pay | Source: Ambulatory Visit | Attending: Obstetrics and Gynecology

## 2015-04-14 ENCOUNTER — Inpatient Hospital Stay (HOSPITAL_COMMUNITY)
Admission: RE | Admit: 2015-04-14 | Discharge: 2015-04-16 | DRG: 766 | Disposition: A | Discharge status: Home / Self Care | Payer: 59 | Source: Ambulatory Visit | Attending: Obstetrics and Gynecology | Admitting: Obstetrics and Gynecology

## 2015-04-14 ENCOUNTER — Encounter (HOSPITAL_COMMUNITY): Payer: Self-pay | Admitting: *Deleted

## 2015-04-14 DIAGNOSIS — O9902 Anemia complicating childbirth: Secondary | ICD-10-CM | POA: Diagnosis present

## 2015-04-14 DIAGNOSIS — Z98891 History of uterine scar from previous surgery: Secondary | ICD-10-CM

## 2015-04-14 DIAGNOSIS — Z3A39 39 weeks gestation of pregnancy: Secondary | ICD-10-CM | POA: Diagnosis not present

## 2015-04-14 DIAGNOSIS — O99824 Streptococcus B carrier state complicating childbirth: Secondary | ICD-10-CM | POA: Diagnosis present

## 2015-04-14 DIAGNOSIS — D649 Anemia, unspecified: Secondary | ICD-10-CM | POA: Diagnosis present

## 2015-04-14 DIAGNOSIS — O321XX0 Maternal care for breech presentation, not applicable or unspecified: Secondary | ICD-10-CM | POA: Diagnosis present

## 2015-04-14 DIAGNOSIS — O34211 Maternal care for low transverse scar from previous cesarean delivery: Principal | ICD-10-CM | POA: Diagnosis present

## 2015-04-14 SURGERY — Surgical Case
Anesthesia: Spinal

## 2015-04-14 MED ORDER — SIMETHICONE 80 MG PO CHEW
80.0000 mg | CHEWABLE_TABLET | Freq: Three times a day (TID) | ORAL | Status: DC
  Administered 2015-04-14 – 2015-04-16 (×5): 80 mg via ORAL
  Filled 2015-04-14 (×10): qty 1

## 2015-04-14 MED ORDER — BUPIVACAINE IN DEXTROSE 0.75-8.25 % IT SOLN
INTRATHECAL | Status: DC | PRN
  Administered 2015-04-14: 1.2 mL via INTRATHECAL

## 2015-04-14 MED ORDER — LACTATED RINGERS IV SOLN: INTRAVENOUS | Status: DC

## 2015-04-14 MED ORDER — PRENATAL MULTIVITAMIN CH
1.0000 | ORAL_TABLET | Freq: Every day | ORAL | Status: DC
  Administered 2015-04-15: 1 via ORAL
  Filled 2015-04-14 (×3): qty 1

## 2015-04-14 MED ORDER — OXYCODONE-ACETAMINOPHEN 5-325 MG PO TABS: 2.0000 | ORAL_TABLET | ORAL | Status: DC | PRN

## 2015-04-14 MED ORDER — FENTANYL CITRATE (PF) 100 MCG/2ML IJ SOLN
25.0000 ug | INTRAMUSCULAR | Status: DC | PRN
  Administered 2015-04-14: 25 ug via INTRAVENOUS

## 2015-04-14 MED ORDER — DEXTROSE 5 % IV SOLN
1.0000 ug/kg/h | INTRAVENOUS | Status: DC | PRN
Start: 2015-04-14 — End: 2015-04-16
  Filled 2015-04-14: qty 2

## 2015-04-14 MED ORDER — NALBUPHINE HCL 10 MG/ML IJ SOLN: 5.0000 mg | Freq: Once | INTRAMUSCULAR | Status: DC | PRN

## 2015-04-14 MED ORDER — SCOPOLAMINE 1 MG/3DAYS TD PT72
MEDICATED_PATCH | TRANSDERMAL | Status: AC
  Filled 2015-04-14: qty 1

## 2015-04-14 MED ORDER — ONDANSETRON HCL 4 MG/2ML IJ SOLN
INTRAMUSCULAR | Status: AC
  Filled 2015-04-14: qty 2

## 2015-04-14 MED ORDER — OXYCODONE-ACETAMINOPHEN 5-325 MG PO TABS
1.0000 | ORAL_TABLET | ORAL | Status: DC | PRN
  Administered 2015-04-15 – 2015-04-16 (×6): 1 via ORAL
  Filled 2015-04-14 (×7): qty 1

## 2015-04-14 MED ORDER — PHENYLEPHRINE 8 MG IN D5W 100 ML (0.08MG/ML) PREMIX OPTIME
INJECTION | INTRAVENOUS | Status: AC
  Filled 2015-04-14: qty 100

## 2015-04-14 MED ORDER — FENTANYL CITRATE (PF) 100 MCG/2ML IJ SOLN
INTRAMUSCULAR | Status: AC
  Administered 2015-04-14: 25 ug via INTRAVENOUS
  Filled 2015-04-14: qty 2

## 2015-04-14 MED ORDER — CEFAZOLIN SODIUM-DEXTROSE 2-3 GM-% IV SOLR
2.0000 g | INTRAVENOUS | Status: AC
  Administered 2015-04-14: 2 g via INTRAVENOUS

## 2015-04-14 MED ORDER — FENTANYL CITRATE (PF) 100 MCG/2ML IJ SOLN
INTRAMUSCULAR | Status: DC | PRN
  Administered 2015-04-14: 12.5 ug via INTRATHECAL

## 2015-04-14 MED ORDER — SCOPOLAMINE 1 MG/3DAYS TD PT72
MEDICATED_PATCH | TRANSDERMAL | Status: AC
  Administered 2015-04-14: 1.5 mg via TRANSDERMAL
  Filled 2015-04-14: qty 1

## 2015-04-14 MED ORDER — NALBUPHINE HCL 10 MG/ML IJ SOLN: 5.0000 mg | INTRAMUSCULAR | Status: DC | PRN

## 2015-04-14 MED ORDER — DIPHENHYDRAMINE HCL 50 MG/ML IJ SOLN: 12.5000 mg | INTRAMUSCULAR | Status: DC | PRN

## 2015-04-14 MED ORDER — SODIUM CHLORIDE 0.9% FLUSH: 3.0000 mL | INTRAVENOUS | Status: DC | PRN

## 2015-04-14 MED ORDER — KETOROLAC TROMETHAMINE 30 MG/ML IJ SOLN: 30.0000 mg | Freq: Four times a day (QID) | INTRAMUSCULAR | Status: DC | PRN

## 2015-04-14 MED ORDER — WITCH HAZEL-GLYCERIN EX PADS: 1.0000 "application " | MEDICATED_PAD | CUTANEOUS | Status: DC | PRN

## 2015-04-14 MED ORDER — MENTHOL 3 MG MT LOZG
1.0000 | LOZENGE | OROMUCOSAL | Status: DC | PRN
  Filled 2015-04-14: qty 9

## 2015-04-14 MED ORDER — ACETAMINOPHEN 325 MG PO TABS: 650.0000 mg | ORAL_TABLET | ORAL | Status: DC | PRN

## 2015-04-14 MED ORDER — OXYTOCIN 10 UNIT/ML IJ SOLN
INTRAMUSCULAR | Status: AC
  Filled 2015-04-14: qty 4

## 2015-04-14 MED ORDER — SENNOSIDES-DOCUSATE SODIUM 8.6-50 MG PO TABS
2.0000 | ORAL_TABLET | ORAL | Status: DC
  Administered 2015-04-15 (×2): 2 via ORAL
  Filled 2015-04-14 (×4): qty 2

## 2015-04-14 MED ORDER — DEXAMETHASONE SODIUM PHOSPHATE 4 MG/ML IJ SOLN
INTRAMUSCULAR | Status: AC
  Filled 2015-04-14: qty 1

## 2015-04-14 MED ORDER — KETOROLAC TROMETHAMINE 30 MG/ML IJ SOLN
INTRAMUSCULAR | Status: AC
  Administered 2015-04-14: 30 mg
  Filled 2015-04-14: qty 1

## 2015-04-14 MED ORDER — DEXAMETHASONE SODIUM PHOSPHATE 4 MG/ML IJ SOLN
INTRAMUSCULAR | Status: DC | PRN
  Administered 2015-04-14: 4 mg via INTRAVENOUS

## 2015-04-14 MED ORDER — SCOPOLAMINE 1 MG/3DAYS TD PT72
1.0000 | MEDICATED_PATCH | Freq: Once | TRANSDERMAL | Status: DC
  Administered 2015-04-14: 1.5 mg via TRANSDERMAL

## 2015-04-14 MED ORDER — DIPHENHYDRAMINE HCL 25 MG PO CAPS
25.0000 mg | ORAL_CAPSULE | Freq: Four times a day (QID) | ORAL | Status: DC | PRN
  Filled 2015-04-14: qty 1

## 2015-04-14 MED ORDER — SIMETHICONE 80 MG PO CHEW
80.0000 mg | CHEWABLE_TABLET | ORAL | Status: DC
  Administered 2015-04-15 (×2): 80 mg via ORAL
  Filled 2015-04-14 (×4): qty 1

## 2015-04-14 MED ORDER — ZOLPIDEM TARTRATE 5 MG PO TABS: 5.0000 mg | ORAL_TABLET | Freq: Every evening | ORAL | Status: DC | PRN

## 2015-04-14 MED ORDER — ONDANSETRON HCL 4 MG/2ML IJ SOLN: 4.0000 mg | Freq: Three times a day (TID) | INTRAMUSCULAR | Status: DC | PRN

## 2015-04-14 MED ORDER — LACTATED RINGERS IV SOLN
40.0000 [IU] | INTRAVENOUS | Status: DC | PRN
  Administered 2015-04-14: 40 [IU] via INTRAVENOUS

## 2015-04-14 MED ORDER — DIPHENHYDRAMINE HCL 25 MG PO CAPS
25.0000 mg | ORAL_CAPSULE | ORAL | Status: DC | PRN
  Filled 2015-04-14: qty 1

## 2015-04-14 MED ORDER — TETANUS-DIPHTH-ACELL PERTUSSIS 5-2.5-18.5 LF-MCG/0.5 IM SUSP
0.5000 mL | Freq: Once | INTRAMUSCULAR | Status: DC
  Filled 2015-04-14: qty 0.5

## 2015-04-14 MED ORDER — MEPERIDINE HCL 25 MG/ML IJ SOLN: 6.2500 mg | INTRAMUSCULAR | Status: DC | PRN

## 2015-04-14 MED ORDER — OXYTOCIN 10 UNIT/ML IJ SOLN: 2.5000 [IU]/h | INTRAVENOUS | Status: AC

## 2015-04-14 MED ORDER — MORPHINE SULFATE (PF) 0.5 MG/ML IJ SOLN
INTRAMUSCULAR | Status: AC
  Filled 2015-04-14: qty 10

## 2015-04-14 MED ORDER — DIBUCAINE 1 % RE OINT
1.0000 | TOPICAL_OINTMENT | RECTAL | Status: DC | PRN
Start: 2015-04-14 — End: 2015-04-16
  Filled 2015-04-14: qty 28

## 2015-04-14 MED ORDER — FENTANYL CITRATE (PF) 100 MCG/2ML IJ SOLN
INTRAMUSCULAR | Status: AC
  Filled 2015-04-14: qty 2

## 2015-04-14 MED ORDER — LACTATED RINGERS IV SOLN
Freq: Once | INTRAVENOUS | Status: AC
  Administered 2015-04-14 (×2): via INTRAVENOUS

## 2015-04-14 MED ORDER — ONDANSETRON HCL 4 MG/2ML IJ SOLN
INTRAMUSCULAR | Status: DC | PRN
  Administered 2015-04-14: 4 mg via INTRAVENOUS

## 2015-04-14 MED ORDER — LACTATED RINGERS IV SOLN
INTRAVENOUS | Status: DC
  Administered 2015-04-15: 125 mL/h via INTRAVENOUS

## 2015-04-14 MED ORDER — LANOLIN HYDROUS EX OINT: 1.0000 "application " | TOPICAL_OINTMENT | CUTANEOUS | Status: DC | PRN

## 2015-04-14 MED ORDER — IBUPROFEN 600 MG PO TABS
600.0000 mg | ORAL_TABLET | Freq: Four times a day (QID) | ORAL | Status: DC
  Administered 2015-04-14 – 2015-04-16 (×7): 600 mg via ORAL
  Filled 2015-04-14 (×7): qty 1

## 2015-04-14 MED ORDER — SODIUM CHLORIDE 0.9 % IR SOLN
Status: DC | PRN
  Administered 2015-04-14: 1000 mL

## 2015-04-14 MED ORDER — CEFAZOLIN SODIUM-DEXTROSE 2-3 GM-% IV SOLR
INTRAVENOUS | Status: AC
  Filled 2015-04-14: qty 50

## 2015-04-14 MED ORDER — PHENYLEPHRINE 8 MG IN D5W 100 ML (0.08MG/ML) PREMIX OPTIME
INJECTION | INTRAVENOUS | Status: DC | PRN
  Administered 2015-04-14: 60 ug/min via INTRAVENOUS

## 2015-04-14 MED ORDER — SCOPOLAMINE 1 MG/3DAYS TD PT72: 1.0000 | MEDICATED_PATCH | Freq: Once | TRANSDERMAL | Status: DC

## 2015-04-14 MED ORDER — SIMETHICONE 80 MG PO CHEW
80.0000 mg | CHEWABLE_TABLET | ORAL | Status: DC | PRN
  Filled 2015-04-14: qty 1

## 2015-04-14 MED ORDER — MORPHINE SULFATE (PF) 0.5 MG/ML IJ SOLN
INTRAMUSCULAR | Status: DC | PRN
  Administered 2015-04-14: .1 mg via INTRATHECAL

## 2015-04-14 MED ORDER — NALOXONE HCL 0.4 MG/ML IJ SOLN: 0.4000 mg | INTRAMUSCULAR | Status: DC | PRN

## 2015-04-14 MED ORDER — LACTATED RINGERS IV SOLN
INTRAVENOUS | Status: DC
  Administered 2015-04-14: 10:00:00 via INTRAVENOUS

## 2015-04-14 SURGICAL SUPPLY — 35 items
BENZOIN TINCTURE PRP APPL 2/3 (GAUZE/BANDAGES/DRESSINGS) ×2 IMPLANT
CLAMP CORD UMBIL (MISCELLANEOUS) IMPLANT
CLOTH BEACON ORANGE TIMEOUT ST (SAFETY) ×2 IMPLANT
CONTAINER PREFILL 10% NBF 15ML (MISCELLANEOUS) IMPLANT
DRSG OPSITE POSTOP 4X10 (GAUZE/BANDAGES/DRESSINGS) ×2 IMPLANT
DURAPREP 26ML APPLICATOR (WOUND CARE) ×2 IMPLANT
ELECT REM PT RETURN 9FT ADLT (ELECTROSURGICAL) ×2
ELECTRODE REM PT RTRN 9FT ADLT (ELECTROSURGICAL) ×1 IMPLANT
EXTRACTOR VACUUM M CUP 4 TUBE (SUCTIONS) IMPLANT
GLOVE BIO SURGEON STRL SZ7.5 (GLOVE) ×2 IMPLANT
GLOVE BIOGEL PI IND STRL 7.0 (GLOVE) ×1 IMPLANT
GLOVE BIOGEL PI IND STRL 7.5 (GLOVE) ×1 IMPLANT
GLOVE BIOGEL PI INDICATOR 7.0 (GLOVE) ×1
GLOVE BIOGEL PI INDICATOR 7.5 (GLOVE) ×1
GOWN STRL REUS W/TWL LRG LVL3 (GOWN DISPOSABLE) ×4 IMPLANT
KIT ABG SYR 3ML LUER SLIP (SYRINGE) IMPLANT
NEEDLE HYPO 25X5/8 SAFETYGLIDE (NEEDLE) IMPLANT
NS IRRIG 1000ML POUR BTL (IV SOLUTION) ×2 IMPLANT
PACK C SECTION WH (CUSTOM PROCEDURE TRAY) ×2 IMPLANT
PAD OB MATERNITY 4.3X12.25 (PERSONAL CARE ITEMS) ×2 IMPLANT
PENCIL SMOKE EVAC W/HOLSTER (ELECTROSURGICAL) ×2 IMPLANT
RTRCTR C-SECT PINK 25CM LRG (MISCELLANEOUS) ×2 IMPLANT
SPONGE LAP 18X18 X RAY DECT (DISPOSABLE) ×2 IMPLANT
STRIP CLOSURE SKIN 1/2X4 (GAUZE/BANDAGES/DRESSINGS) ×2 IMPLANT
SUT CHROMIC 2 0 CT 1 (SUTURE) ×2 IMPLANT
SUT MNCRL AB 3-0 PS2 27 (SUTURE) ×4 IMPLANT
SUT PLAIN 0 NONE (SUTURE) IMPLANT
SUT PLAIN 2 0 (SUTURE) ×1
SUT PLAIN 2 0 XLH (SUTURE) ×2 IMPLANT
SUT PLAIN ABS 2-0 CT1 27XMFL (SUTURE) ×1 IMPLANT
SUT VIC AB 0 CT1 36 (SUTURE) ×2 IMPLANT
SUT VIC AB 0 CTX 36 (SUTURE) ×3
SUT VIC AB 0 CTX36XBRD ANBCTRL (SUTURE) ×3 IMPLANT
TOWEL OR 17X24 6PK STRL BLUE (TOWEL DISPOSABLE) ×2 IMPLANT
TRAY FOLEY CATH SILVER 14FR (SET/KITS/TRAYS/PACK) ×2 IMPLANT

## 2015-04-14 NOTE — Interval H&P Note (Signed)
History and Physical Interval Note:  04/14/2015 10:18 AM  Jenna Potts  has presented today for surgery, with the diagnosis of Prior Cesarean Section  The various methods of treatment have been discussed with the patient and family. After consideration of risks, benefits and other options for treatment, the patient has consented to  Procedure(s): CESAREAN SECTION (N/A) as a surgical intervention .  The patient's history has been reviewed, patient examined, no change in status, stable for surgery.  I have reviewed the patient's chart and labs.  Questions were answered to the patient's satisfaction.     Purcell NailsOBERTS,Evelean Bigler Y

## 2015-04-14 NOTE — Op Note (Signed)
Cesarean Section Procedure Note  Indications: P2 at 5939 1/7wks with h/o 2 prior c-sections for scheduled repeat c-section  Pre-operative Diagnosis: repeat cesarean section; prior cesarean section   Post-operative Diagnosis: 1.Repeat cesarean section; prior cesarean section 2.Breech  Procedure: REPEAT CESAREAN SECTION  Surgeon: Osborn CohoAngela Victorhugo Preis, MD    Assistants: Jolayne Pantherachell Stall, CNM  Anesthesia: Regional  Procedure Details  The patient was taken to the operating room secondary to repeat c-section after the risks, benefits, complications, treatment options, and expected outcomes were discussed with the patient.  The patient concurred with the proposed plan, giving informed consent which was signed and witnessed. The patient was taken to Operating Room C-Section Suite, identified as [name] and the procedure verified as C-Section Delivery. A Time Out was held and the above information confirmed.  After induction of anesthesia by obtaining a spinal, the patient was prepped and draped in the usual sterile manner. A Pfannenstiel skin incision was made and carried down through the subcutaneous tissue to the underlying layer of fascia.  The fascia was incised bilaterally and extended transversely bilaterally with the Mayo scissors. Kocher clamps were placed on the inferior aspect of the fascial incision and the underlying rectus muscle was separated from the fascia. The same was done on the superior aspect of the fascial incision.  The peritoneum was identified, entered bluntly and extended manually.  An Alexis self-retaining retractor was placed.  The utero-vesical peritoneal reflection was incised transversely and the bladder flap was bluntly freed from the lower uterine segment. A low transverse uterine incision was made with the scalpel and extended bilaterally with the bandage scissors.  The infant was delivered via breech extraction without difficulty.  After the umbilical cord was clamped and  cut, the infant was handed to the awaiting pediatricians.  Cord blood was obtained for evaluation.  The placenta was removed intact and appeared to be within normal limits. The uterus was cleared of all clots and debris. The uterine incision was closed with running interlocking sutures of 0 Vicryl and a second imbricating layer was performed as well.   Bilateral tubes and ovaries appeared to be within normal limits.  Good hemostasis was noted.  Copious irrigation was performed until clear.  The peritoneum was repaired with 2-0 chromic via a running suture.  The fascia was reapproximated with a running suture of 0 Vicryl. The subcutaneous tissue was reapproximated with 3 interrupted sutures of 2-0 plain.  The skin was reapproximated with a subcuticular suture of 3-0 monocryl.  Steristrips were applied.  Instrument, sponge, and needle counts were correct prior to abdominal closure and at the conclusion of the case.  The patient was awaiting transfer to the recovery room in good condition.  Findings: Live female infant with Apgars 9 at one minute and 9 at five minutes.  Normal appearing bilateral ovaries and fallopian tubes were noted.  Estimated Blood Loss:  500 ml         Drains: foley to gravity 500 cc clear urine         Total IV Fluids: 2300 ml         Specimens to Pathology: n/a         Complications:  None; patient tolerated the procedure well.         Disposition: PACU - hemodynamically stable.         Condition: stable  Attending Attestation: I performed the procedure.

## 2015-04-14 NOTE — Transfer of Care (Signed)
Immediate Anesthesia Transfer of Care Note  Patient: Jenna Potts  Procedure(s) Performed: Procedure(s): CESAREAN SECTION (N/A)  Patient Location: PACU  Anesthesia Type:Spinal  Level of Consciousness: awake, alert  and oriented  Airway & Oxygen Therapy: Patient Spontanous Breathing  Post-op Assessment: Report given to RN and Post -op Vital signs reviewed and stable  Post vital signs: Reviewed and stable  Last Vitals:  Filed Vitals:   04/14/15 0923  BP: 112/85  Pulse: 67  Temp: 36.7 C  Resp: 18    Complications: No apparent anesthesia complications

## 2015-04-14 NOTE — Lactation Note (Signed)
This note was copied from a baby's chart. Lactation Consultation Note  Patient Name: Jenna Potts Today's Date: 04/14/2015 Reason for consult: Initial assessment Baby at 5 hr of life. Experienced bf mom reports latching is going well and she can hear the baby swallow. Denies breast or nipple, no concerns voiced. She bf her oldest 7 months and had latch trouble in the first 2 weeks. She bf her 2nd baby for 12 months with no troubles. She stated she can hand express, has seen colostrum bilaterally, and has a spoon in the room. Discussed baby behavior, feeding frequency, baby belly size, voids, wt loss, breast changes, and nipple care. Given lactation handouts. Aware of OP services and support group.     Maternal Data Has patient been taught Hand Expression?: Yes  Feeding Feeding Type: Breast Fed Length of feed: 15 min  LATCH Score/Interventions                      Lactation Tools Discussed/Used WIC Program: No   Consult Status Consult Status: Follow-up Date: 04/15/15 Follow-up type: In-patient    Rulon Eisenmengerlizabeth E Nealie Mchatton 04/14/2015, 4:51 PM

## 2015-04-14 NOTE — Anesthesia Postprocedure Evaluation (Signed)
Anesthesia Post Note  Patient: Jenna Potts  Procedure(s) Performed: Procedure(s) (LRB): CESAREAN SECTION (N/A)  Patient location during evaluation: PACU Anesthesia Type: Spinal Level of consciousness: oriented and awake and alert Pain management: pain level controlled Vital Signs Assessment: post-procedure vital signs reviewed and stable Respiratory status: spontaneous breathing, respiratory function stable and patient connected to nasal cannula oxygen Cardiovascular status: blood pressure returned to baseline and stable Postop Assessment: no headache and no backache Anesthetic complications: no    Last Vitals:  Filed Vitals:   04/14/15 1200 04/14/15 1215  BP: 112/63 92/58  Pulse: 59 53  Temp:    Resp: 16 13    Last Pain:  Filed Vitals:   04/14/15 1222  PainSc: 4                  Phillips Groutarignan, Sivan Quast

## 2015-04-14 NOTE — Anesthesia Procedure Notes (Signed)
Spinal Patient location during procedure: OR Staffing Anesthesiologist: Charli Halle Performed by: anesthesiologist  Preanesthetic Checklist Completed: patient identified, site marked, surgical consent, pre-op evaluation, timeout performed, IV checked, risks and benefits discussed and monitors and equipment checked Spinal Block Patient position: sitting Prep: ChloraPrep Patient monitoring: heart rate, continuous pulse ox and blood pressure Approach: midline Location: L3-4 Injection technique: single-shot Needle Needle type: Sprotte  Needle gauge: 24 G Needle length: 9 cm Additional Notes Expiration date of kit checked and confirmed. Patient tolerated procedure well, without complications.     

## 2015-04-14 NOTE — Anesthesia Preprocedure Evaluation (Signed)
Anesthesia Evaluation  Patient identified by MRN, date of birth, ID band Patient awake    Reviewed: Allergy & Precautions, NPO status , Patient's Chart, lab work & pertinent test results  Airway Mallampati: II  TM Distance: >3 FB Neck ROM: Full    Dental no notable dental hx.    Pulmonary neg pulmonary ROS,    Pulmonary exam normal breath sounds clear to auscultation       Cardiovascular negative cardio ROS Normal cardiovascular exam Rhythm:Regular Rate:Normal     Neuro/Psych negative neurological ROS  negative psych ROS   GI/Hepatic negative GI ROS, Neg liver ROS,   Endo/Other  negative endocrine ROS  Renal/GU negative Renal ROS  negative genitourinary   Musculoskeletal negative musculoskeletal ROS (+)   Abdominal   Peds negative pediatric ROS (+)  Hematology negative hematology ROS (+)   Anesthesia Other Findings   Reproductive/Obstetrics (+) Pregnancy                             Anesthesia Physical Anesthesia Plan  ASA: II  Anesthesia Plan: Spinal   Post-op Pain Management:    Induction:   Airway Management Planned: Natural Airway  Additional Equipment:   Intra-op Plan:   Post-operative Plan:   Informed Consent: I have reviewed the patients History and Physical, chart, labs and discussed the procedure including the risks, benefits and alternatives for the proposed anesthesia with the patient or authorized representative who has indicated his/her understanding and acceptance.   Dental advisory given  Plan Discussed with: CRNA  Anesthesia Plan Comments:         Anesthesia Quick Evaluation  

## 2015-04-15 LAB — CBC
HEMATOCRIT: 31.2 % — AB (ref 36.0–46.0)
Hemoglobin: 10.4 g/dL — ABNORMAL LOW (ref 12.0–15.0)
MCH: 29 pg (ref 26.0–34.0)
MCHC: 33.3 g/dL (ref 30.0–36.0)
MCV: 86.9 fL (ref 78.0–100.0)
Platelets: 189 10*3/uL (ref 150–400)
RBC: 3.59 MIL/uL — ABNORMAL LOW (ref 3.87–5.11)
RDW: 14.8 % (ref 11.5–15.5)
WBC: 9.3 10*3/uL (ref 4.0–10.5)

## 2015-04-15 NOTE — Progress Notes (Signed)
Subjective: Postpartum Day 1: Cesarean Delivery due to repeat Patient up ad lib, reports no syncope or dizziness. Feeding:  breast Contraceptive plan:  unsure  Objective: Vital signs in last 24 hours: Temp:  [97.4 F (36.3 C)-98.7 F (37.1 C)] 98.7 F (37.1 C) (03/04 0830) Pulse Rate:  [50-77] 58 (03/04 0830) Resp:  [10-21] 18 (03/04 0830) BP: (92-112)/(51-68) 100/53 mmHg (03/04 0830) SpO2:  [96 %-99 %] 97 % (03/04 0830)  Physical Exam:  General: alert and cooperative Lochia: appropriate Uterine Fundus: firm Abdomen:  + bowel sounds, non distended Incision: healing well  Honeycomb dressing CDI DVT Evaluation: No evidence of DVT seen on physical exam. Homan's sign: Negative   Recent Labs  04/15/15 0530  HGB 10.4*  HCT 31.2*  WBC 9.3   Orthostatics stable.  Assessment: Status post Cesarean section day 1. Doing well postoperatively.  Honeycomb dressing in place, no significant drainage Anemia - hemodynamicly stable.      Plan: Continue current care. Possible DC for tomorrow     Torris House, CNM, MSN 04/15/2015. 10:02 AM

## 2015-04-15 NOTE — Anesthesia Postprocedure Evaluation (Signed)
Anesthesia Post Note  Patient: Jenna Potts  Procedure(s) Performed: Procedure(s) (LRB): CESAREAN SECTION (N/A)  Patient location during evaluation: Mother Baby Anesthesia Type: Spinal Level of consciousness: oriented and awake and alert Pain management: pain level controlled Vital Signs Assessment: post-procedure vital signs reviewed and stable Respiratory status: spontaneous breathing, respiratory function stable and patient connected to nasal cannula oxygen Cardiovascular status: blood pressure returned to baseline and stable Postop Assessment: no headache and no backache Anesthetic complications: no Comments: Patient states pain 5 now has just received Percocet  Her pain goal is a 4    Last Vitals:  Filed Vitals:   04/15/15 0430 04/15/15 0830  BP: 99/53 100/53  Pulse: 60 58  Temp: 36.7 C 37.1 C  Resp: 18 18    Last Pain:  Filed Vitals:   04/15/15 0934  PainSc: 5                  Khayden Herzberg

## 2015-04-15 NOTE — Addendum Note (Signed)
Addendum  created 04/15/15 1050 by Rica RecordsAngela Monay Houlton, CRNA   Modules edited: Clinical Notes   Clinical Notes:  File: [id number]

## 2015-04-16 MED ORDER — OXYCODONE-ACETAMINOPHEN 5-325 MG PO TABS: 1.0000 | ORAL_TABLET | ORAL | Status: DC | PRN

## 2015-04-16 MED ORDER — IBUPROFEN 600 MG PO TABS: 600.0000 mg | ORAL_TABLET | Freq: Four times a day (QID) | ORAL | Status: DC

## 2015-04-16 MED ORDER — FERROUS SULFATE 325 (65 FE) MG PO TBEC: 325.0000 mg | DELAYED_RELEASE_TABLET | Freq: Two times a day (BID) | ORAL | Status: AC

## 2015-04-16 NOTE — Discharge Summary (Signed)
OB Discharge Summary  Patient Name: Jenna Potts DOB: May 30, 1980 MRN: [id number]  Date of admission: 04/14/2015 Delivering MD: Osborn Coho   Date of discharge: 04/16/2015  Admitting diagnosis: Prior Cesarean Section Intrauterine pregnancy: [redacted]w[redacted]d     Secondary diagnosis:Active Problems:   S/P repeat low transverse C-section  Additional problems:none     Discharge diagnosis: Term Pregnancy Delivered                                                                     Post partum procedures:none  Augmentation: none  Complications: None  Hospital course:  Sceduled C/S   35 y.o. yo [id number] at [redacted]w[redacted]d was admitted to the hospital 04/14/2015 for scheduled cesarean section with the following indication:Elective Repeat.  Membrane Rupture Time/Date: 11:00 AM ,04/14/2015   Patient delivered a Viable infant.04/14/2015  Details of operation can be found in separate operative note.  Pateint had an uncomplicated postpartum course.  She is ambulating, tolerating a regular diet, passing flatus, and urinating well. Patient is discharged home in stable condition on  04/16/2015          Physical exam  Filed Vitals:   04/15/15 0830 04/15/15 1230 04/15/15 1810 04/16/15 0611  BP: 100/53 109/65 105/63 111/73  Pulse: 58 71 70 65  Temp: 98.7 F (37.1 C) 98.6 F (37 C) 98.4 F (36.9 C) 98.1 F (36.7 C)  TempSrc: Oral Oral Oral Oral  Resp: 18 18 18 20   SpO2: 97% 98% 97%    General: alert, cooperative and no distress Lochia: appropriate Uterine Fundus: firm Incision: Healing well with no significant drainage DVT Evaluation: No evidence of DVT seen on physical exam. Labs: Lab Results  Component Value Date   WBC 9.3 04/15/2015   HGB 10.4* 04/15/2015   HCT 31.2* 04/15/2015   MCV 86.9 04/15/2015   PLT 189 04/15/2015   No flowsheet data found.  Discharge instruction: per After Visit Summary and "Baby and Me Booklet".  Medications:  Current facility-administered medications:  .  acetaminophen  (TYLENOL) tablet 650 mg, 650 mg, Oral, Q4H PRN, Osborn Coho, MD .  witch hazel-glycerin (TUCKS) pad 1 application, 1 application, Topical, PRN **AND** dibucaine (NUPERCAINAL) 1 % rectal ointment 1 application, 1 application, Rectal, PRN, Osborn Coho, MD .  diphenhydrAMINE (BENADRYL) capsule 25 mg, 25 mg, Oral, Q6H PRN, Osborn Coho, MD .  ibuprofen (ADVIL,MOTRIN) tablet 600 mg, 600 mg, Oral, 4 times per day, Osborn Coho, MD, 600 mg at 04/16/15 0559 .  lactated ringers infusion, , Intravenous, Continuous, Osborn Coho, MD, Last Rate: 125 mL/hr at 04/15/15 0024, 125 mL/hr at 04/15/15 0024 .  lanolin ointment 1 application, 1 application, Topical, PRN, Osborn Coho, MD .  menthol-cetylpyridinium (CEPACOL) lozenge 3 mg, 1 lozenge, Oral, Q2H PRN, Osborn Coho, MD .  naloxone [facility]) 2 mg in dextrose 5 % 250 mL infusion, 1-4 mcg/kg/hr, Intravenous, Continuous PRN, Phillips Grout, MD .  naloxone [facility]) injection 0.4 mg, 0.4 mg, Intravenous, PRN **AND** sodium chloride flush (NS) 0.9 % injection 3 mL, 3 mL, Intravenous, PRN, Phillips Grout, MD .  ondansetron [facility]) injection 4 mg, 4 mg, Intravenous, Q8H PRN, Phillips Grout, MD .  oxyCODONE-acetaminophen (PERCOCET/ROXICET) 5-325 MG per tablet 1 tablet, 1 tablet, Oral, Q4H PRN, Osborn Coho, MD, 1 tablet at 04/16/15  0559 .  oxyCODONE-acetaminophen (PERCOCET/ROXICET) 5-325 MG per tablet 2 tablet, 2 tablet, Oral, Q4H PRN, Osborn Coho, MD .  prenatal multivitamin tablet 1 tablet, 1 tablet, Oral, Q1200, Osborn Coho, MD, 1 tablet at 04/15/15 1144 .  scopolamine (TRANSDERM-SCOP) 1 MG/3DAYS 1.5 mg, 1 patch, Transdermal, Once, Phillips Grout, MD .  senna-docusate (Senokot-S) tablet 2 tablet, 2 tablet, Oral, Q24H, Osborn Coho, MD, 2 tablet at 04/15/15 2328 .  simethicone (MYLICON) chewable tablet 80 mg, 80 mg, Oral, TID PC, Osborn Coho, MD, 80 mg at 04/16/15 0846 .  simethicone (MYLICON) chewable tablet 80 mg, 80 mg, Oral, Q24H,  Osborn Coho, MD, 80 mg at 04/15/15 2328 .  simethicone (MYLICON) chewable tablet 80 mg, 80 mg, Oral, PRN, Osborn Coho, MD .  Tdap (BOOSTRIX) injection 0.5 mL, 0.5 mL, Intramuscular, Once, Osborn Coho, MD, 0.5 mL at 04/15/15 1000 .  zolpidem (AMBIEN) tablet 5 mg, 5 mg, Oral, QHS PRN, Osborn Coho, MD After Visit Meds:    Medication List    STOP taking these medications        calcium carbonate 500 MG chewable tablet  Commonly known as:  TUMS - dosed in mg elemental calcium      TAKE these medications        ferrous sulfate 325 (65 FE) MG EC tablet  Take 1 tablet (325 mg total) by mouth 2 (two) times daily.     ibuprofen 600 MG tablet  Commonly known as:  ADVIL,MOTRIN  Take 1 tablet (600 mg total) by mouth every 6 (six) hours.     oxyCODONE-acetaminophen 5-325 MG tablet  Commonly known as:  PERCOCET/ROXICET  Take 1 tablet by mouth every 4 (four) hours as needed (pain scale 4-7).     prenatal multivitamin Tabs tablet  Take 1 tablet by mouth at bedtime.        Diet: routine diet  Activity: Advance as tolerated. Pelvic rest for 6 weeks.   Outpatient follow up:6 weeks Follow up Appt:No future appointments. Follow up visit: No Follow-up on file.  Postpartum contraception: Undecided  Newborn Data: Live born female  Birth Weight: 8 lb 1.5 oz (3670 g) APGAR: 9, 9  Baby Feeding: Breast Disposition:home with mother   04/16/2015 Xzavian Semmel, CNM     Care After Cesarean Delivery  Refer to this sheet in the next few weeks. These instructions provide you with information on caring for yourself after your procedure. Your caregiver may also give you specific instructions. Your treatment has been planned according to current medical practices, but problems sometimes occur. Call your caregiver if you have any problems or questions after you go home. HOME CARE INSTRUCTIONS  Only take over-the-counter or prescription medicines as directed by your  caregiver.  Do not drink alcohol, especially if you are breastfeeding or taking medicine to relieve pain.  Do not chew or smoke tobacco.  Continue to use good perineal care. Good perineal care includes:  Wiping your perineum from front to back.  Keeping your perineum clean.  Check your cut (incision) daily for increased redness, drainage, swelling, or separation of skin.  Clean your incision gently with soap and water every day, and then pat it dry. If your caregiver says it is okay, leave the incision uncovered. Use a bandage (dressing) if the incision is draining fluid or appears irritated. If the adhesive strips across the incision do not fall off within 7 days, carefully peel them off.  Hug a pillow when coughing or sneezing until your incision is healed.  This helps to relieve pain.  Do not use tampons or douche until your caregiver says it is okay.  Shower, wash your hair, and take tub baths as directed by your caregiver.  Wear a well-fitting bra that provides breast support.  Limit wearing support panties or control-top hose.  Drink enough fluids to keep your urine clear or pale yellow.  Eat high-fiber foods such as whole grain cereals and breads, brown rice, beans, and fresh fruits and vegetables every day. These foods may help prevent or relieve constipation.  Resume activities such as climbing stairs, driving, lifting, exercising, or traveling as directed by your caregiver.  Talk to your caregiver about resuming sexual activities. This is dependent upon your risk of infection, your rate of healing, and your comfort and desire to resume sexual activity.  Try to have someone help you with your household activities and your newborn for at least a few days after you leave the hospital.  Rest as much as possible. Try to rest or take a nap when your newborn is sleeping.  Increase your activities gradually.  Keep all of your scheduled postpartum appointments. It is very  important to keep your scheduled follow-up appointments. At these appointments, your caregiver will be checking to make sure that you are healing physically and emotionally. SEEK MEDICAL CARE IF:   You are passing large clots from your vagina. Save any clots to show your caregiver.  You have a foul smelling discharge from your vagina.  You have trouble urinating.  You are urinating frequently.  You have pain when you urinate.  You have a change in your bowel movements.  You have increasing redness, pain, or swelling near your incision.  You have pus draining from your incision.  Your incision is separating.  You have painful, hard, or reddened breasts.  You have a severe headache.  You have blurred vision or see spots.  You feel sad or depressed.  You have thoughts of hurting yourself or your newborn.  You have questions about your care, the care of your newborn, or medicines.  You are dizzy or lightheaded.  You have a rash.  You have pain, redness, or swelling at the site of the removed intravenous access (IV) tube.  You have nausea or vomiting.  You stopped breastfeeding and have not had a menstrual period within 12 weeks of stopping.  You are not breastfeeding and have not had a menstrual period within 12 weeks of delivery.  You have a fever. SEEK IMMEDIATE MEDICAL CARE IF:  You have persistent pain.  You have chest pain.  You have shortness of breath.  You faint.  You have leg pain.  You have stomach pain.  Your vaginal bleeding saturates 2 or more sanitary pads in 1 hour. MAKE SURE YOU:   Understand these instructions.  Will watch your condition.  Will get help right away if you are not doing well or get worse. Document Released: 10/20/2001 Document Revised: 10/23/2011 Document Reviewed: 09/25/2011 [facility] Patient Information 2014 [employer], [place].   Postpartum Depression and Baby Blues  The postpartum period begins right after the birth  of a baby. During this time, there is often a great amount of joy and excitement. It is also a time of considerable changes in the life of the parent(s). Regardless of how many times a mother gives birth, each child brings new challenges and dynamics to the family. It is not unusual to have feelings of excitement accompanied by confusing shifts in moods, emotions,  and thoughts. All mothers are at risk of developing postpartum depression or the "baby blues." These mood changes can occur right after giving birth, or they may occur many months after giving birth. The baby blues or postpartum depression can be mild or severe. Additionally, postpartum depression can resolve rather quickly, or it can be a long-term condition. CAUSES Elevated hormones and their rapid decline are thought to be a main cause of postpartum depression and the baby blues. There are a number of hormones that radically change during and after pregnancy. Estrogen and progesterone usually decrease immediately after delivering your baby. The level of thyroid hormone and various cortisol steroids also rapidly drop. Other factors that play a major role in these changes include major life events and genetics.  RISK FACTORS If you have any of the following risks for the baby blues or postpartum depression, know what symptoms to watch out for during the postpartum period. Risk factors that may increase the likelihood of getting the baby blues or postpartum depression include: 1. Havinga personal or family history of depression. 2. Having depression while being pregnant. 3. Having premenstrual or oral contraceptive-associated mood issues. 4. Having exceptional life stress. 5. Having marital conflict. 6. Lacking a social support network. 7. Having a baby with special needs. 8. Having health problems such as diabetes. SYMPTOMS Baby blues symptoms include:  Brief fluctuations in mood, such as going from extreme happiness to  sadness.  Decreased concentration.  Difficulty sleeping.  Crying spells, tearfulness.  Irritability.  Anxiety. Postpartum depression symptoms typically begin within the first month after giving birth. These symptoms include:  Difficulty sleeping or excessive sleepiness.  Marked weight loss.  Agitation.  Feelings of worthlessness.  Lack of interest in activity or food. Postpartum psychosis is a very concerning condition and can be dangerous. Fortunately, it is rare. Displaying any of the following symptoms is cause for immediate medical attention. Postpartum psychosis symptoms include:  Hallucinations and delusions.  Bizarre or disorganized behavior.  Confusion or disorientation. DIAGNOSIS  A diagnosis is made by an evaluation of your symptoms. There are no medical or lab tests that lead to a diagnosis, but there are various questionnaires that a caregiver may use to identify those with the baby blues, postpartum depression, or psychosis. Often times, a screening tool called the [place] Postnatal Depression Scale is used to diagnose depression in the postpartum period.  TREATMENT The baby blues usually goes away on its own in 1 to 2 weeks. Social support is often all that is needed. You should be encouraged to get adequate sleep and rest. Occasionally, you may be given medicines to help you sleep.  Postpartum depression requires treatment as it can last several months or longer if it is not treated. Treatment may include individual or group therapy, medicine, or both to address any social, physiological, and psychological factors that may play a role in the depression. Regular exercise, a healthy diet, rest, and social support may also be strongly recommended.  Postpartum psychosis is more serious and needs treatment right away. Hospitalization is often needed. HOME CARE INSTRUCTIONS  Get as much rest as you can. Nap when the baby sleeps.  Exercise regularly. Some women find  yoga and walking to be beneficial.  Eat a balanced and nourishing diet.  Do little things that you enjoy. Have a cup of tea, take a bubble bath, read your favorite magazine, or listen to your favorite music.  Avoid alcohol.  Ask for help with household chores, cooking, grocery shopping, or  running errands as needed. Do not try to do everything.  Talk to people close to you about how you are feeling. Get support from your partner, family members, friends, or other new moms.  Try to stay positive in how you think. Think about the things you are grateful for.  Do not spend a lot of time alone.  Only take medicine as directed by your caregiver.  Keep all your postpartum appointments.  Let your caregiver know if you have any concerns. SEEK MEDICAL CARE IF: You are having a reaction or problems with your medicine. SEEK IMMEDIATE MEDICAL CARE IF:  You have suicidal feelings.  You feel you may harm the baby or someone else. Document Released: 11/02/2003 Document Revised: 04/22/2011 Document Reviewed: 12/04/2010 [facility] Patient Information 2014 [place], [place].   Breastfeeding Deciding to breastfeed is one of the best choices you can make for you and your baby. A change in hormones during pregnancy causes your breast tissue to grow and increases the number and size of your milk ducts. These hormones also allow proteins, sugars, and fats from your blood supply to make breast milk in your milk-producing glands. Hormones prevent breast milk from being released before your baby is born as well as prompt milk flow after birth. Once breastfeeding has begun, thoughts of your baby, as well as his or her sucking or crying, can stimulate the release of milk from your milk-producing glands.  BENEFITS OF BREASTFEEDING For Your Baby  Your first milk (colostrum) helps your baby's digestive system function better.   There are antibodies in your milk that help your baby fight off infections.    Your baby has a lower incidence of asthma, allergies, and sudden infant death syndrome.   The nutrients in breast milk are better for your baby than infant formulas and are designed uniquely for your baby's needs.   Breast milk improves your baby's brain development.   Your baby is less likely to develop other conditions, such as childhood obesity, asthma, or type 2 diabetes mellitus.  For You   Breastfeeding helps to create a very special bond between you and your baby.   Breastfeeding is convenient. Breast milk is always available at the correct temperature and costs nothing.   Breastfeeding helps to burn calories and helps you lose the weight gained during pregnancy.   Breastfeeding makes your uterus contract to its prepregnancy size faster and slows bleeding (lochia) after you give birth.   Breastfeeding helps to lower your risk of developing type 2 diabetes mellitus, osteoporosis, and breast or ovarian cancer later in life. SIGNS THAT YOUR BABY IS HUNGRY Early Signs of Hunger  Increased alertness or activity.  Stretching.  Movement of the head from side to side.  Movement of the head and opening of the mouth when the corner of the mouth or cheek is stroked (rooting).  Increased sucking sounds, smacking lips, cooing, sighing, or squeaking.  Hand-to-mouth movements.  Increased sucking of fingers or hands. Late Signs of Hunger  Fussing.  Intermittent crying. Extreme Signs of Hunger Signs of extreme hunger will require calming and consoling before your baby will be able to breastfeed successfully. Do not wait for the following signs of extreme hunger to occur before you initiate breastfeeding:   Restlessness.  A loud, strong cry.   Screaming.  BREASTFEEDING BASICS Breastfeeding Initiation  Find a comfortable place to sit or lie down, with your neck and back well supported.  Place a pillow or rolled up blanket under your baby  to bring him or her to  the level of your breast (if you are seated). Nursing pillows are specially designed to help support your arms and your baby while you breastfeed.  Make sure that your baby's abdomen is facing your abdomen.   Gently massage your breast. With your fingertips, massage from your chest wall toward your nipple in a circular motion. This encourages milk flow. You may need to continue this action during the feeding if your milk flows slowly.  Support your breast with 4 fingers underneath and your thumb above your nipple. Make sure your fingers are well away from your nipple and your baby's mouth.   Stroke your baby's lips gently with your finger or nipple.   When your baby's mouth is open wide enough, quickly bring your baby to your breast, placing your entire nipple and as much of the colored area around your nipple (areola) as possible into your baby's mouth.   More areola should be visible above your baby's upper lip than below the lower lip.   Your baby's tongue should be between his or her lower gum and your breast.   Ensure that your baby's mouth is correctly positioned around your nipple (latched). Your baby's lips should create a seal on your breast and be turned out (everted).  It is common for your baby to suck about 2-3 minutes in order to start the flow of breast milk. Latching Teaching your baby how to latch on to your breast properly is very important. An improper latch can cause nipple pain and decreased milk supply for you and poor weight gain in your baby. Also, if your baby is not latched onto your nipple properly, he or she may swallow some air during feeding. This can make your baby fussy. Burping your baby when you switch breasts during the feeding can help to get rid of the air. However, teaching your baby to latch on properly is still the best way to prevent fussiness from swallowing air while breastfeeding. Signs that your baby has successfully latched on to your nipple:     Silent tugging or silent sucking, without causing you pain.   Swallowing heard between every 3-4 sucks.    Muscle movement above and in front of his or her ears while sucking.  Signs that your baby has not successfully latched on to nipple:   Sucking sounds or smacking sounds from your baby while breastfeeding.  Nipple pain. If you think your baby has not latched on correctly, slip your finger into the corner of your baby's mouth to break the suction and place it between your baby's gums. Attempt breastfeeding initiation again. Signs of Successful Breastfeeding Signs from your baby:   A gradual decrease in the number of sucks or complete cessation of sucking.   Falling asleep.   Relaxation of his or her body.   Retention of a small amount of milk in his or her mouth.   Letting go of your breast by himself or herself. Signs from you:  Breasts that have increased in firmness, weight, and size 1-3 hours after feeding.   Breasts that are softer immediately after breastfeeding.  Increased milk volume, as well as a change in milk consistency and color by the fifth day of breastfeeding.   Nipples that are not sore, cracked, or bleeding. Signs That Your Pecola Leisure is Getting Enough Milk  Wetting at least 3 diapers in a 24-hour period. The urine should be clear and pale yellow by age 20 days.  At least 3 stools in a 24-hour period by age 37 days. The stool should be soft and yellow.  At least 3 stools in a 24-hour period by age 69 days. The stool should be seedy and yellow.  No loss of weight greater than 10% of birth weight during the first 73 days of age.  Average weight gain of 4-7 ounces (113-198 g) per week after age 37 days.  Consistent daily weight gain by age 37 days, without weight loss after the age of 2 weeks. After a feeding, your baby may spit up a small amount. This is common. BREASTFEEDING FREQUENCY AND DURATION Frequent feeding will help you make more milk and  can prevent sore nipples and breast engorgement. Breastfeed when you feel the need to reduce the fullness of your breasts or when your baby shows signs of hunger. This is called "breastfeeding on demand." Avoid introducing a pacifier to your baby while you are working to establish breastfeeding (the first 4-6 weeks after your baby is born). After this time you may choose to use a pacifier. Research has shown that pacifier use during the first year of a baby's life decreases the risk of sudden infant death syndrome (SIDS). Allow your baby to feed on each breast as long as he or she wants. Breastfeed until your baby is finished feeding. When your baby unlatches or falls asleep while feeding from the first breast, offer the second breast. Because newborns are often sleepy in the first few weeks of life, you may need to awaken your baby to get him or her to feed. Breastfeeding times will vary from baby to baby. However, the following rules can serve as a guide to help you ensure that your baby is properly fed:  Newborns (babies 11 weeks of age or younger) may breastfeed every 1-3 hours.  Newborns should not go longer than 3 hours during the day or 5 hours during the night without breastfeeding.  You should breastfeed your baby a minimum of 8 times in a 24-hour period until you begin to introduce solid foods to your baby at around 34 months of age. BREAST MILK PUMPING Pumping and storing breast milk allows you to ensure that your baby is exclusively fed your breast milk, even at times when you are unable to breastfeed. This is especially important if you are going back to work while you are still breastfeeding or when you are not able to be present during feedings. Your lactation consultant can give you guidelines on how long it is safe to store breast milk.  A breast pump is a machine that allows you to pump milk from your breast into a sterile bottle. The pumped breast milk can then be stored in a refrigerator  or freezer. Some breast pumps are operated by hand, while others use electricity. Ask your lactation consultant which type will work best for you. Breast pumps can be purchased, but some hospitals and breastfeeding support groups lease breast pumps on a monthly basis. A lactation consultant can teach you how to hand express breast milk, if you prefer not to use a pump.  CARING FOR YOUR BREASTS WHILE YOU BREASTFEED Nipples can become dry, cracked, and sore while breastfeeding. The following recommendations can help keep your breasts moisturized and healthy:  Avoid using soap on your nipples.   Wear a supportive bra. Although not required, special nursing bras and tank tops are designed to allow access to your breasts for breastfeeding without taking off your entire bra  or top. Avoid wearing underwire-style bras or extremely tight bras.  Air dry your nipples for 3-73minutes after each feeding.   Use only cotton bra pads to absorb leaked breast milk. Leaking of breast milk between feedings is normal.   Use lanolin on your nipples after breastfeeding. Lanolin helps to maintain your skin's normal moisture barrier. If you use pure lanolin, you do not need to wash it off before feeding your baby again. Pure lanolin is not toxic to your baby. You may also hand express a few drops of breast milk and gently massage that milk into your nipples and allow the milk to air dry. In the first few weeks after giving birth, some women experience extremely full breasts (engorgement). Engorgement can make your breasts feel heavy, warm, and tender to the touch. Engorgement peaks within 3-5 days after you give birth. The following recommendations can help ease engorgement:  Completely empty your breasts while breastfeeding or pumping. You may want to start by applying warm, moist heat (in the shower or with warm water-soaked hand towels) just before feeding or pumping. This increases circulation and helps the milk flow.  If your baby does not completely empty your breasts while breastfeeding, pump any extra milk after he or she is finished.  Wear a snug bra (nursing or regular) or tank top for 1-2 days to signal your body to slightly decrease milk production.  Apply ice packs to your breasts, unless this is too uncomfortable for you.  Make sure that your baby is latched on and positioned properly while breastfeeding. If engorgement persists after 48 hours of following these recommendations, contact your health care provider or a Advertising copywriter. OVERALL HEALTH CARE RECOMMENDATIONS WHILE BREASTFEEDING  Eat healthy foods. Alternate between meals and snacks, eating 3 of each per day. Because what you eat affects your breast milk, some of the foods may make your baby more irritable than usual. Avoid eating these foods if you are sure that they are negatively affecting your baby.  Drink milk, fruit juice, and water to satisfy your thirst (about 10 glasses a day).   Rest often, relax, and continue to take your prenatal vitamins to prevent fatigue, stress, and anemia.  Continue breast self-awareness checks.  Avoid chewing and smoking tobacco.  Avoid alcohol and drug use. Some medicines that may be harmful to your baby can pass through breast milk. It is important to ask your health care provider before taking any medicine, including all over-the-counter and prescription medicine as well as vitamin and herbal supplements. It is possible to become pregnant while breastfeeding. If birth control is desired, ask your health care provider about options that will be safe for your baby. SEEK MEDICAL CARE IF:   You feel like you want to stop breastfeeding or have become frustrated with breastfeeding.  You have painful breasts or nipples.  Your nipples are cracked or bleeding.  Your breasts are red, tender, or warm.  You have a swollen area on either breast.  You have a fever or chills.  You have nausea or  vomiting.  You have drainage other than breast milk from your nipples.  Your breasts do not become full before feedings by the fifth day after you give birth.  You feel sad and depressed.  Your baby is too sleepy to eat well.  Your baby is having trouble sleeping.   Your baby is wetting less than 3 diapers in a 24-hour period.  Your baby has less than 3 stools in a  24-hour period.  Your baby's skin or the white part of his or her eyes becomes yellow.   Your baby is not gaining weight by 215 days of age. SEEK IMMEDIATE MEDICAL CARE IF:   Your baby is overly tired (lethargic) and does not want to wake up and feed.  Your baby develops an unexplained fever. Document Released: 01/28/2005 Document Revised: 02/02/2013 Document Reviewed: 07/22/2012 [facility] Patient Information 2015 [place]. This information is not intended to replace advice given to you by your health care provider. Make sure you discuss any questions you have with your health care provider.

## 2015-04-16 NOTE — Lactation Note (Signed)
This note was copied from a baby's chart. Lactation Consultation Note  Patient Name: Jenna Billey ChangJulianne Moss ZOXWR'UToday's Date: 04/16/2015 Reason for consult: Follow-up assessment;Other (Comment);Infant weight loss (7% weight loss, per mom F/U Tuesday with Pedis )  Baby is 46 hours old , for early D/C . Per mom the baby has been cluster feeding . Present;y breast feeding and latched with depth.  Mom denies soreness. Sore nipple and engorgement prevention and tx reviewed.  Mother informed of post-discharge support and given phone number to the lactation department, including services for phone call  assistance; out-patient appointments; and breastfeeding support group. List of other breastfeeding resources in the community given  in the handout. Encouraged mother to call for problems or concerns related to breastfeeding.  Maternal Data    Feeding Feeding Type:  (baby latched , per mom the baby has been cluster feeding ) Length of feed: 12 min (LC observed baby already latched )  LATCH Score/Interventions                Intervention(s): Breastfeeding basics reviewed     Lactation Tools Discussed/Used WIC Program: No   Consult Status Consult Status: Complete Date: 04/16/15    Kathrin Greathouseorio, Lanissa Cashen Ann 04/16/2015, 9:39 AM

## 2015-04-17 ENCOUNTER — Encounter (HOSPITAL_COMMUNITY): Payer: Self-pay | Admitting: Obstetrics and Gynecology

## 2016-07-05 LAB — OB RESULTS CONSOLE HIV ANTIBODY (ROUTINE TESTING): HIV: NONREACTIVE

## 2016-07-05 LAB — OB RESULTS CONSOLE GBS: GBS: POSITIVE

## 2016-07-05 LAB — OB RESULTS CONSOLE RUBELLA ANTIBODY, IGM: RUBELLA: IMMUNE

## 2016-07-05 LAB — OB RESULTS CONSOLE ANTIBODY SCREEN: ANTIBODY SCREEN: NEGATIVE

## 2016-07-05 LAB — OB RESULTS CONSOLE GC/CHLAMYDIA
Chlamydia: NEGATIVE
GC PROBE AMP, GENITAL: NEGATIVE

## 2016-07-05 LAB — OB RESULTS CONSOLE ABO/RH: RH Type: POSITIVE

## 2016-07-05 LAB — OB RESULTS CONSOLE HEPATITIS B SURFACE ANTIGEN: HEP B S AG: NEGATIVE

## 2016-07-05 LAB — OB RESULTS CONSOLE RPR: RPR: NONREACTIVE

## 2017-01-14 ENCOUNTER — Encounter (HOSPITAL_COMMUNITY): Payer: Self-pay | Admitting: *Deleted

## 2017-01-28 ENCOUNTER — Encounter (HOSPITAL_COMMUNITY)
Admission: RE | Admit: 2017-01-28 | Discharge: 2017-01-28 | Disposition: A | Discharge status: Home / Self Care | Payer: 59 | Source: Ambulatory Visit | Attending: Obstetrics & Gynecology | Admitting: Obstetrics & Gynecology

## 2017-01-28 HISTORY — DX: Adverse effect of unspecified anesthetic, initial encounter: T41.45XA

## 2017-01-28 HISTORY — DX: Other specified postprocedural states: Z98.890

## 2017-01-28 HISTORY — DX: Personal history of other infectious and parasitic diseases: Z86.19

## 2017-01-28 HISTORY — DX: Other complications of anesthesia, initial encounter: T88.59XA

## 2017-01-28 HISTORY — DX: Other specified postprocedural states: R11.2

## 2017-01-28 LAB — CBC
HCT: 38 % (ref 36.0–46.0)
HEMOGLOBIN: 12.8 g/dL (ref 12.0–15.0)
MCH: 31.4 pg (ref 26.0–34.0)
MCHC: 33.7 g/dL (ref 30.0–36.0)
MCV: 93.4 fL (ref 78.0–100.0)
PLATELETS: 193 10*3/uL (ref 150–400)
RBC: 4.07 MIL/uL (ref 3.87–5.11)
RDW: 13.3 % (ref 11.5–15.5)
WBC: 7.1 10*3/uL (ref 4.0–10.5)

## 2017-01-28 LAB — TYPE AND SCREEN
ABO/RH(D): A POS
ANTIBODY SCREEN: NEGATIVE

## 2017-01-28 NOTE — Patient Instructions (Signed)
Comer LocketJulianne N Kobayashi  01/28/2017   Your procedure is scheduled on:  01/29/2017  [facility].  Pick up the phone at the desk and dial [phone]  Call this number if you have problems the morning of surgery:[phone]  Remember:   Do not eat food:After Midnight.  Do not drink clear liquids: After Midnight.  Take these medicines the morning of surgery with A SIP OF WATER: none   Do not wear jewelry, make-up or nail polish.  Do not wear lotions, powders, or perfumes. Do not wear deodorant.  Do not shave 48 hours prior to surgery.  Do not bring valuables to the hospital.  [facility] not   responsible for any belongings or valuables brought to the hospital.  Contacts, dentures or bridgework may not be worn into surgery.  Leave suitcase in the car. After surgery it may be brought to your room.  For patients admitted to the hospital, checkout time is 11:00 AM the day of              discharge.    N/A   Please read over the following fact sheets that you were given:   Surgical Site Infection Prevention

## 2017-01-28 NOTE — H&P (Signed)
Jenna Potts is a 36 y.o. female preComer Locketsenting for Repeat C/S and BTL.  Antepartum course complicated by previous C/S x 3.  GBS positive.  OB History    Gravida Para Term Preterm AB Living   4 3 3     2    SAB TAB Ectopic Multiple Live Births         0 2     Past Medical History:  Diagnosis Date  . Anemia 2017   with current pregnancy  . Complication of anesthesia   . Dysrhythmia 2003   low K+ due to dehydration after sports  . Heartburn during pregnancy   . Hx of varicella   . No pertinent past medical history   . PONV (postoperative nausea and vomiting)   . Spondylolisthesis    Past Surgical History:  Procedure Laterality Date  . CESAREAN SECTION  2009  . CESAREAN SECTION  10/19/2010   Procedure: CESAREAN SECTION;  Surgeon: Mickel Baasichard D Kaplan;  Location: [facility];  Service: Gynecology;  Laterality: N/A;  . CESAREAN SECTION N/A 04/14/2015   Procedure: CESAREAN SECTION;  Surgeon: Osborn CohoAngela Roberts, MD;  Location: [facility];  Service: Obstetrics;  Laterality: N/A;  . WISDOM TOOTH EXTRACTION     Family History: family history includes Hypertension in her mother and sister; Prostate cancer in her father and paternal grandfather. Social History:  reports that  has never smoked. she has never used smokeless tobacco. She reports that she does not drink alcohol or use drugs.     Maternal Diabetes: No Genetic Screening: Normal Maternal Ultrasounds/Referrals: Normal Fetal Ultrasounds or other Referrals:  None Maternal Substance Abuse:  No Significant Maternal Medications:  None Significant Maternal Lab Results:  Lab values include: Group B Strep positive Other Comments:  None  ROS Maternal Medical History:  Prenatal complications: no prenatal complications Prenatal Complications - Diabetes: none.      Last menstrual period 04/28/2016, unknown if currently breastfeeding. Maternal Exam:  Abdomen: Patient reports no abdominal tenderness. Surgical scars: low transverse.   Fundal height  is c/w dates.   Estimated fetal weight is 7#8.       Physical Exam  Constitutional: She is oriented to person, place, and time. She appears well-developed and well-nourished.  GI: Soft. There is no rebound and no guarding.  Neurological: She is alert and oriented to person, place, and time.  Skin: Skin is warm and dry.  Psychiatric: She has a normal mood and affect. Her behavior is normal.    Prenatal labs: ABO, Rh: A/Positive/-- (05/25 0000) Antibody: Negative (05/25 0000) Rubella: Immune (05/25 0000) RPR: Nonreactive (05/25 0000)  HBsAg: Negative (05/25 0000)  HIV: Non-reactive (05/25 0000)  GBS: Positive (05/25 0000)   Assessment/Plan: 36yo [id number] at 39 weeks for repeat C/S and BTL -Patient has been counseled re: risk of bleeding, infection, scarring, and damage to surrounding structures.  She understands the risk of permanence and regret.  All questions were answered and the patient wishes to proceed.  Khadim Lundberg 01/28/2017, 6:29 AM

## 2017-01-29 ENCOUNTER — Encounter (HOSPITAL_COMMUNITY)
Admission: RE | Disposition: A | Discharge status: Home / Self Care | Payer: Self-pay | Source: Ambulatory Visit | Attending: Obstetrics & Gynecology

## 2017-01-29 ENCOUNTER — Encounter (HOSPITAL_COMMUNITY): Payer: Self-pay | Admitting: *Deleted

## 2017-01-29 ENCOUNTER — Inpatient Hospital Stay (HOSPITAL_COMMUNITY): Payer: 59 | Admitting: Anesthesiology

## 2017-01-29 ENCOUNTER — Inpatient Hospital Stay (HOSPITAL_COMMUNITY)
Admission: RE | Admit: 2017-01-29 | Discharge: 2017-02-01 | DRG: 785 | Disposition: A | Discharge status: Home / Self Care | Payer: 59 | Source: Ambulatory Visit | Attending: Obstetrics & Gynecology | Admitting: Obstetrics & Gynecology

## 2017-01-29 DIAGNOSIS — O34211 Maternal care for low transverse scar from previous cesarean delivery: Principal | ICD-10-CM | POA: Diagnosis present

## 2017-01-29 DIAGNOSIS — Z302 Encounter for sterilization: Secondary | ICD-10-CM | POA: Diagnosis not present

## 2017-01-29 DIAGNOSIS — Z98891 History of uterine scar from previous surgery: Secondary | ICD-10-CM

## 2017-01-29 DIAGNOSIS — Z3A39 39 weeks gestation of pregnancy: Secondary | ICD-10-CM

## 2017-01-29 DIAGNOSIS — O99824 Streptococcus B carrier state complicating childbirth: Secondary | ICD-10-CM | POA: Diagnosis present

## 2017-01-29 LAB — RPR: RPR: NONREACTIVE

## 2017-01-29 SURGERY — Surgical Case
Anesthesia: Spinal | Laterality: Bilateral

## 2017-01-29 MED ORDER — SCOPOLAMINE 1 MG/3DAYS TD PT72SCOPOLAMINE 1 MG/3DAYS
MEDICATED_PATCH | TRANSDERMAL | Status: DC | PRN
Start: 2017-01-29 — End: 2017-01-30
  Administered 2017-01-29: 1 via TRANSDERMAL

## 2017-01-29 MED ORDER — ONDANSETRON HCL 4 MG/2ML IJ SOLN
INTRAMUSCULAR | Status: DC | PRN
  Administered 2017-01-29: 4 mg via INTRAVENOUS

## 2017-01-29 MED ORDER — SENNOSIDES-DOCUSATE SODIUM 8.6-50 MG PO TABS
2.0000 | ORAL_TABLET | ORAL | Status: DC
  Administered 2017-01-30 – 2017-02-01 (×3): 2 via ORAL
  Filled 2017-01-29 (×3): qty 2

## 2017-01-29 MED ORDER — CEFOTETAN DISODIUM-DEXTROSE 2-2.08 GM-%(50ML) IV SOLR
2.0000 g | INTRAVENOUS | Status: AC
  Administered 2017-01-29: 2 g via INTRAVENOUS
  Filled 2017-01-29: qty 50

## 2017-01-29 MED ORDER — ZOLPIDEM TARTRATE 5 MG PO TABS: 5.0000 mg | ORAL_TABLET | Freq: Every evening | ORAL | Status: DC | PRN

## 2017-01-29 MED ORDER — LACTATED RINGERS IV SOLN
INTRAVENOUS | Status: DC
  Administered 2017-01-29: 125 mL/h via INTRAVENOUS

## 2017-01-29 MED ORDER — SIMETHICONE 80 MG PO CHEW
80.0000 mg | CHEWABLE_TABLET | Freq: Three times a day (TID) | ORAL | Status: DC
  Administered 2017-01-30 – 2017-02-01 (×7): 80 mg via ORAL
  Filled 2017-01-29 (×7): qty 1

## 2017-01-29 MED ORDER — OXYCODONE HCL 5 MG PO TABS
10.0000 mg | ORAL_TABLET | ORAL | Status: DC | PRN
Start: 2017-01-29 — End: 2017-02-01
  Administered 2017-01-29 – 2017-02-01 (×8): 10 mg via ORAL
  Filled 2017-01-29 (×8): qty 2

## 2017-01-29 MED ORDER — KETOROLAC TROMETHAMINE 30 MG/ML IJ SOLN: 30.0000 mg | Freq: Four times a day (QID) | INTRAMUSCULAR | Status: AC | PRN

## 2017-01-29 MED ORDER — FENTANYL CITRATE (PF) 100 MCG/2ML IJ SOLN
INTRAMUSCULAR | Status: AC
  Filled 2017-01-29: qty 2

## 2017-01-29 MED ORDER — COCONUT OIL OIL: 1.0000 "application " | TOPICAL_OIL | Status: DC | PRN

## 2017-01-29 MED ORDER — DIPHENHYDRAMINE HCL 25 MG PO CAPS: 25.0000 mg | ORAL_CAPSULE | ORAL | Status: DC | PRN

## 2017-01-29 MED ORDER — OXYCODONE HCL 5 MG PO TABS
5.0000 mg | ORAL_TABLET | ORAL | Status: DC | PRN
  Administered 2017-01-30 – 2017-01-31 (×2): 5 mg via ORAL
  Filled 2017-01-29 (×2): qty 1

## 2017-01-29 MED ORDER — FENTANYL CITRATE (PF) 100 MCG/2ML IJ SOLN
25.0000 ug | INTRAMUSCULAR | Status: DC | PRN
  Administered 2017-01-29: 25 ug via INTRAVENOUS

## 2017-01-29 MED ORDER — LACTATED RINGERS IV SOLN
INTRAVENOUS | Status: DC | PRN
  Administered 2017-01-29: 13:00:00 via INTRAVENOUS

## 2017-01-29 MED ORDER — OXYTOCIN 10 UNIT/ML IJ SOLN
INTRAMUSCULAR | Status: DC | PRN
  Administered 2017-01-29: 40 [IU] via INTRAVENOUS

## 2017-01-29 MED ORDER — LACTATED RINGERS IV SOLN: INTRAVENOUS | Status: DC

## 2017-01-29 MED ORDER — DIBUCAINE 1 % RE OINT: 1.0000 "application " | TOPICAL_OINTMENT | RECTAL | Status: DC | PRN

## 2017-01-29 MED ORDER — LACTATED RINGERS IV SOLN
INTRAVENOUS | Status: DC
  Administered 2017-01-29 (×2): via INTRAVENOUS

## 2017-01-29 MED ORDER — MENTHOL 3 MG MT LOZG: 1.0000 | LOZENGE | OROMUCOSAL | Status: DC | PRN

## 2017-01-29 MED ORDER — OXYTOCIN 10 UNIT/ML IJ SOLN
INTRAMUSCULAR | Status: AC
  Filled 2017-01-29: qty 4

## 2017-01-29 MED ORDER — PRENATAL MULTIVITAMIN CH
1.0000 | ORAL_TABLET | Freq: Every day | ORAL | Status: DC
  Administered 2017-01-30 – 2017-01-31 (×2): 1 via ORAL
  Filled 2017-01-29 (×2): qty 1

## 2017-01-29 MED ORDER — MEPERIDINE HCL 25 MG/ML IJ SOLN: 6.2500 mg | INTRAMUSCULAR | Status: DC | PRN

## 2017-01-29 MED ORDER — ONDANSETRON HCL 4 MG/2ML IJ SOLN
INTRAMUSCULAR | Status: AC
  Filled 2017-01-29: qty 2

## 2017-01-29 MED ORDER — KETOROLAC TROMETHAMINE 30 MG/ML IJ SOLN
INTRAMUSCULAR | Status: AC
  Filled 2017-01-29: qty 1

## 2017-01-29 MED ORDER — ACETAMINOPHEN 325 MG PO TABS
650.0000 mg | ORAL_TABLET | ORAL | Status: DC | PRN
  Administered 2017-01-29 – 2017-02-01 (×5): 650 mg via ORAL
  Filled 2017-01-29 (×5): qty 2

## 2017-01-29 MED ORDER — SIMETHICONE 80 MG PO CHEW: 80.0000 mg | CHEWABLE_TABLET | ORAL | Status: DC | PRN

## 2017-01-29 MED ORDER — NALOXONE HCL 0.4 MG/ML IJ SOLN: 0.4000 mg | INTRAMUSCULAR | Status: DC | PRN

## 2017-01-29 MED ORDER — SCOPOLAMINE 1 MG/3DAYS TD PT72
MEDICATED_PATCH | TRANSDERMAL | Status: AC
  Filled 2017-01-29: qty 1

## 2017-01-29 MED ORDER — NALOXONE HCL 0.4 MG/ML IJ SOLN
1.0000 ug/kg/h | INTRAVENOUS | Status: DC | PRN
  Filled 2017-01-29: qty 5

## 2017-01-29 MED ORDER — SIMETHICONE 80 MG PO CHEW
80.0000 mg | CHEWABLE_TABLET | ORAL | Status: DC
  Administered 2017-01-30 – 2017-02-01 (×3): 80 mg via ORAL
  Filled 2017-01-29 (×3): qty 1

## 2017-01-29 MED ORDER — WITCH HAZEL-GLYCERIN EX PADS: 1.0000 "application " | MEDICATED_PAD | CUTANEOUS | Status: DC | PRN

## 2017-01-29 MED ORDER — ONDANSETRON HCL 4 MG/2ML IJ SOLN: 4.0000 mg | Freq: Three times a day (TID) | INTRAMUSCULAR | Status: DC | PRN

## 2017-01-29 MED ORDER — NALBUPHINE HCL 10 MG/ML IJ SOLN: 5.0000 mg | INTRAMUSCULAR | Status: DC | PRN

## 2017-01-29 MED ORDER — DIPHENHYDRAMINE HCL 25 MG PO CAPS: 25.0000 mg | ORAL_CAPSULE | Freq: Four times a day (QID) | ORAL | Status: DC | PRN

## 2017-01-29 MED ORDER — SODIUM CHLORIDE 0.9 % IR SOLN
Status: DC | PRN
  Administered 2017-01-29: 1000 mL

## 2017-01-29 MED ORDER — PHENYLEPHRINE 8 MG IN D5W 100 ML (0.08MG/ML) PREMIX OPTIME
INJECTION | INTRAVENOUS | Status: AC
  Filled 2017-01-29: qty 100

## 2017-01-29 MED ORDER — SODIUM CHLORIDE 0.9% FLUSH: 3.0000 mL | INTRAVENOUS | Status: DC | PRN

## 2017-01-29 MED ORDER — NALBUPHINE HCL 10 MG/ML IJ SOLN: 5.0000 mg | Freq: Once | INTRAMUSCULAR | Status: DC | PRN

## 2017-01-29 MED ORDER — TETANUS-DIPHTH-ACELL PERTUSSIS 5-2.5-18.5 LF-MCG/0.5 IM SUSP: 0.5000 mL | Freq: Once | INTRAMUSCULAR | Status: DC

## 2017-01-29 MED ORDER — METOCLOPRAMIDE HCL 5 MG/ML IJ SOLN: 10.0000 mg | Freq: Once | INTRAMUSCULAR | Status: DC | PRN

## 2017-01-29 MED ORDER — IBUPROFEN 600 MG PO TABS
600.0000 mg | ORAL_TABLET | Freq: Four times a day (QID) | ORAL | Status: DC
  Administered 2017-01-30 – 2017-02-01 (×10): 600 mg via ORAL
  Filled 2017-01-29 (×10): qty 1

## 2017-01-29 MED ORDER — MORPHINE SULFATE (PF) 0.5 MG/ML IJ SOLN
INTRAMUSCULAR | Status: AC
  Filled 2017-01-29: qty 10

## 2017-01-29 MED ORDER — ACETAMINOPHEN 500 MG PO TABS
1000.0000 mg | ORAL_TABLET | Freq: Four times a day (QID) | ORAL | Status: AC
  Administered 2017-01-30 (×3): 1000 mg via ORAL
  Filled 2017-01-29 (×3): qty 2

## 2017-01-29 MED ORDER — KETOROLAC TROMETHAMINE 30 MG/ML IJ SOLN
30.0000 mg | Freq: Four times a day (QID) | INTRAMUSCULAR | Status: AC | PRN
  Administered 2017-01-29: 30 mg via INTRAMUSCULAR

## 2017-01-29 MED ORDER — NALBUPHINE HCL 10 MG/ML IJ SOLN
5.0000 mg | Freq: Once | INTRAMUSCULAR | Status: DC | PRN
Start: 2017-01-29 — End: 2017-02-01

## 2017-01-29 MED ORDER — DIPHENHYDRAMINE HCL 50 MG/ML IJ SOLN: 12.5000 mg | INTRAMUSCULAR | Status: DC | PRN

## 2017-01-29 MED ORDER — OXYTOCIN 40 UNITS IN LACTATED RINGERS INFUSION - SIMPLE MED: 2.5000 [IU]/h | INTRAVENOUS | Status: AC

## 2017-01-29 MED ORDER — PHENYLEPHRINE 8 MG IN D5W 100 ML (0.08MG/ML) PREMIX OPTIME
INJECTION | INTRAVENOUS | Status: DC | PRN
  Administered 2017-01-29: 60 ug/min via INTRAVENOUS

## 2017-01-29 MED ORDER — SCOPOLAMINE 1 MG/3DAYS TD PT72
1.0000 | MEDICATED_PATCH | Freq: Once | TRANSDERMAL | Status: DC
  Filled 2017-01-29: qty 1

## 2017-01-29 SURGICAL SUPPLY — 35 items
BENZOIN TINCTURE PRP APPL 2/3 (GAUZE/BANDAGES/DRESSINGS) ×2 IMPLANT
CLAMP CORD UMBIL (MISCELLANEOUS) IMPLANT
CLOTH BEACON ORANGE TIMEOUT ST (SAFETY) ×2 IMPLANT
CONTAINER PREFILL 10% NBF 60ML (FORM) ×2 IMPLANT
DERMABOND ADHESIVE PROPEN (GAUZE/BANDAGES/DRESSINGS) ×1
DERMABOND ADVANCED (GAUZE/BANDAGES/DRESSINGS)
DERMABOND ADVANCED .7 DNX12 (GAUZE/BANDAGES/DRESSINGS) IMPLANT
DERMABOND ADVANCED .7 DNX6 (GAUZE/BANDAGES/DRESSINGS) ×1 IMPLANT
DRSG OPSITE POSTOP 4X10 (GAUZE/BANDAGES/DRESSINGS) ×2 IMPLANT
DURAPREP 26ML APPLICATOR (WOUND CARE) ×2 IMPLANT
ELECT REM PT RETURN 9FT ADLT (ELECTROSURGICAL) ×2
ELECTRODE REM PT RTRN 9FT ADLT (ELECTROSURGICAL) ×1 IMPLANT
EXTRACTOR VACUUM KIWI (MISCELLANEOUS) IMPLANT
GLOVE BIO SURGEON STRL SZ 6 (GLOVE) ×2 IMPLANT
GLOVE BIOGEL PI IND STRL 6 (GLOVE) ×2 IMPLANT
GLOVE BIOGEL PI IND STRL 7.0 (GLOVE) ×4 IMPLANT
GLOVE BIOGEL PI INDICATOR 6 (GLOVE) ×2
GLOVE BIOGEL PI INDICATOR 7.0 (GLOVE) ×4
GOWN STRL REUS W/TWL LRG LVL3 (GOWN DISPOSABLE) ×4 IMPLANT
KIT ABG SYR 3ML LUER SLIP (SYRINGE) ×2 IMPLANT
NEEDLE HYPO 25X5/8 SAFETYGLIDE (NEEDLE) ×2 IMPLANT
NS IRRIG 1000ML POUR BTL (IV SOLUTION) ×2 IMPLANT
PACK C SECTION WH (CUSTOM PROCEDURE TRAY) ×2 IMPLANT
PAD OB MATERNITY 4.3X12.25 (PERSONAL CARE ITEMS) ×2 IMPLANT
PENCIL SMOKE EVAC W/HOLSTER (ELECTROSURGICAL) ×2 IMPLANT
STRIP CLOSURE SKIN 1/2X4 (GAUZE/BANDAGES/DRESSINGS) IMPLANT
SUT CHROMIC 0 CTX 36 (SUTURE) ×6 IMPLANT
SUT CHROMIC 3 0 SH 27 (SUTURE) ×2 IMPLANT
SUT MON AB 2-0 CT1 27 (SUTURE) ×2 IMPLANT
SUT PDS AB 0 CT1 27 (SUTURE) IMPLANT
SUT PLAIN 0 NONE (SUTURE) ×2 IMPLANT
SUT VIC AB 0 CT1 36 (SUTURE) IMPLANT
SUT VIC AB 4-0 KS 27 (SUTURE) IMPLANT
TOWEL OR 17X24 6PK STRL BLUE (TOWEL DISPOSABLE) ×4 IMPLANT
TRAY FOLEY BAG SILVER LF 14FR (SET/KITS/TRAYS/PACK) ×2 IMPLANT

## 2017-01-29 NOTE — Anesthesia Postprocedure Evaluation (Signed)
Anesthesia Post Note  Patient: Jenna Potts  Procedure(s) Performed: REPEAT CESAREAN SECTION WITH BILATERAL TUBAL LIGATION (Bilateral )     Patient location during evaluation: Mother Baby Anesthesia Type: Spinal Level of consciousness: awake Pain management: pain level controlled Vital Signs Assessment: post-procedure vital signs reviewed and stable Respiratory status: spontaneous breathing Cardiovascular status: stable Postop Assessment: no headache, spinal receding and patient able to bend at knees Anesthetic complications: no    Last Vitals:  Vitals:   01/29/17 1713 01/29/17 1821  BP: 108/73 99/62  Pulse: 82 71  Resp: 18 18  Temp: 36.6 C 36.9 C  SpO2: 95% 94%    Last Pain:  Vitals:   01/29/17 1821  TempSrc: Oral  PainSc:    Pain Goal:                 Edison PaceWILKERSON,Sayuri Rhames

## 2017-01-29 NOTE — Transfer of Care (Signed)
Immediate Anesthesia Transfer of Care Note  Patient: Jenna Potts  Procedure(s) Performed: REPEAT CESAREAN SECTION WITH BILATERAL TUBAL LIGATION (Bilateral )  Patient Location: PACU  Anesthesia Type:Spinal  Level of Consciousness: awake, alert  and oriented  Airway & Oxygen Therapy: Patient Spontanous Breathing  Post-op Assessment: Report given to RN  Post vital signs: Reviewed and stable  Last Vitals:  Vitals:   01/29/17 1131  Resp: 18  Temp: 36.9 C    Last Pain:  Vitals:   01/29/17 1131  TempSrc: Oral         Complications: no apparent anesthesia complications

## 2017-01-29 NOTE — Progress Notes (Signed)
No change to H&P.  Nalany Steedley, DO 

## 2017-01-29 NOTE — Progress Notes (Signed)
Dr Acey Lavarignan notified that pt has increased pain at 7(incisional)  Pt requesting perocet. Oxy IR and tylenol on order set.  Orders received to pt  10g of OxyIR and tylenol 650mg .

## 2017-01-29 NOTE — Op Note (Signed)
Jenna Potts PROCEDURE DATE: 01/29/2017  PREOPERATIVE DIAGNOSIS: Intrauterine pregnancy at  671w3d weeks gestation, previous C/S x 3, desires sterility  POSTOPERATIVE DIAGNOSIS: The same  PROCEDURE:  Repeat Low Transverse Cesarean Section, Bilateral Tubal Ligation  SURGEON:  Dr. Mitchel HonourMegan Catlin Doria  INDICATIONS: Jenna Potts is a 36 y.o. [id number] at 6171w3d scheduled for cesarean section secondary to three previous C/S.  The risks of cesarean section discussed with the patient included but were not limited to: bleeding which may require transfusion or reoperation; infection which may require antibiotics; injury to bowel, bladder, ureters or other surrounding organs; injury to the fetus; need for additional procedures including hysterectomy in the event of a life-threatening hemorrhage; placental abnormalities wth subsequent pregnancies, incisional problems, thromboembolic phenomenon and other postoperative/anesthesia complications. The patient concurred with the proposed plan, giving informed written consent for the procedure.    FINDINGS:  Viable female infant in cephalic presentation, APGARs 8,9: weight pending  Clear amniotic fluid.  Intact placenta, three vessel cord.  Grossly normal uterus, ovaries and fallopian tubes. .   ANESTHESIA: Spinal ESTIMATED BLOOD LOSS: 800 mL ml SPECIMENS: Placenta sent to L&D, bilateral fallopian tube segments to pathology. COMPLICATIONS: None immediate  PROCEDURE IN DETAIL:  The patient received intravenous antibiotics and had sequential compression devices applied to her lower extremities while in the preoperative area.  She was then taken to the operating room where spinal anesthesia was administered and was found to be adequate. She was then placed in a dorsal supine position with a leftward tilt, and prepped and draped in a sterile manner.  A foley catheter was placed into her bladder and attached to constant gravity.  After an adequate timeout was performed, a  Pfannenstiel skin incision was made with scalpel and carried through to the underlying layer of fascia. The fascia was incised in the midline and this incision was extended bilaterally using the Mayo scissors. Kocher clamps were applied to the superior aspect of the fascial incision and the underlying rectus muscles were dissected off bluntly. A similar process was carried out on the inferior aspect of the facial incision. The rectus muscles were separated in the midline bluntly and the peritoneum was entered bluntly.  Bladder flap was created sharply and developed bluntly.  Bladder blade was placed.  A transverse hysterotomy was made with a scalpel and extended bilaterally bluntly. The bladder blade was then removed. The infant was successfully delivered, and cord was clamped and cut and infant was handed over to awaiting neonatology team. Uterine massage was then administered and the placenta delivered intact with three-vessel cord. The uterus was cleared of clot and debris.  The hysterotomy was closed with 0 chromic.  A second imbricating suture of 0-chromic was used to reinforce the incision and aid in hemostasis.  Attention was turned to the fallopian tubes which were tied x 2 with plain gut.  The tubal knuckles were excised with excellent hemostasis. The peritoneum and rectus muscles were noted to be hemostatic and were reapproximated using 3-0 monocryl in a running fashion.  The fascia was closed with 0-PDS in a running fashion with good restoration of anatomy.  The subcutaneus tissue was copiously irrigated.  The skin was closed with 4-0 vicryl in a subcuticular fashion.  Pt tolerated the procedure will.  All counts were correct x2.  Pt went to the recovery room in stable condition.

## 2017-01-29 NOTE — Anesthesia Preprocedure Evaluation (Signed)
Anesthesia Evaluation  Patient identified by MRN, date of birth, ID band Patient awake    Reviewed: Allergy & Precautions, NPO status , Patient's Chart, lab work & pertinent test results  Airway Mallampati: II  TM Distance: >3 FB Neck ROM: Full    Dental no notable dental hx.    Pulmonary neg pulmonary ROS,    Pulmonary exam normal breath sounds clear to auscultation       Cardiovascular negative cardio ROS Normal cardiovascular exam Rhythm:Regular Rate:Normal     Neuro/Psych negative neurological ROS  negative psych ROS   GI/Hepatic negative GI ROS, Neg liver ROS,   Endo/Other  negative endocrine ROS  Renal/GU negative Renal ROS  negative genitourinary   Musculoskeletal negative musculoskeletal ROS (+)   Abdominal   Peds negative pediatric ROS (+)  Hematology negative hematology ROS (+)   Anesthesia Other Findings   Reproductive/Obstetrics (+) Pregnancy                             Anesthesia Physical  Anesthesia Plan  ASA: II  Anesthesia Plan: Spinal   Post-op Pain Management:    Induction:   PONV Risk Score and Plan:   Airway Management Planned: Natural Airway  Additional Equipment:   Intra-op Plan:   Post-operative Plan:   Informed Consent: I have reviewed the patients History and Physical, chart, labs and discussed the procedure including the risks, benefits and alternatives for the proposed anesthesia with the patient or authorized representative who has indicated his/her understanding and acceptance.   Dental advisory given  Plan Discussed with: CRNA  Anesthesia Plan Comments:         Anesthesia Quick Evaluation

## 2017-01-30 ENCOUNTER — Encounter (HOSPITAL_COMMUNITY): Payer: Self-pay | Admitting: Obstetrics & Gynecology

## 2017-01-30 LAB — CBC
HEMATOCRIT: 27.2 % — AB (ref 36.0–46.0)
HEMOGLOBIN: 9.6 g/dL — AB (ref 12.0–15.0)
MCH: 32 pg (ref 26.0–34.0)
MCHC: 35.3 g/dL (ref 30.0–36.0)
MCV: 90.7 fL (ref 78.0–100.0)
Platelets: 157 10*3/uL (ref 150–400)
RBC: 3 MIL/uL — AB (ref 3.87–5.11)
RDW: 13.7 % (ref 11.5–15.5)
WBC: 8.6 10*3/uL (ref 4.0–10.5)

## 2017-01-30 LAB — BIRTH TISSUE RECOVERY COLLECTION (PLACENTA DONATION)

## 2017-01-30 NOTE — Lactation Note (Signed)
This note was copied from a baby's chart. Lactation Consultation Note  Patient Name: Celestia KhatBoy Sheronica Chambless YQMVH'QToday's Date: 01/30/2017 Reason for consult: Follow-up assessment Baby at 28 hr of life. Experienced bf mom reports baby is doing better with latching. She feels like her milk is transitioning. Discussed baby behavior, feeding frequency, baby belly size, voids, wt loss, breast changes, and nipple care. She can manually express and knows how to spoon feed. She is aware of lactation services and support group. She will call as needed.     Maternal Data    Feeding Feeding Type: Breast Fed Length of feed: 12 min  LATCH Score Latch: Repeated attempts needed to sustain latch, nipple held in mouth throughout feeding, stimulation needed to elicit sucking reflex.  Audible Swallowing: Spontaneous and intermittent  Type of Nipple: Everted at rest and after stimulation  Comfort (Breast/Nipple): Soft / non-tender  Hold (Positioning): No assistance needed to correctly position infant at breast.  LATCH Score: 9  Interventions    Lactation Tools Discussed/Used     Consult Status Consult Status: PRN    Rulon Eisenmengerlizabeth E Javyon Fontan 01/30/2017, 5:52 PM

## 2017-01-30 NOTE — Progress Notes (Signed)
Subjective: Postpartum Day 1: Cesarean Delivery Patient reports tolerating PO and no problems voiding.    Objective: Vital signs in last 24 hours: Temp:  [94.5 F (34.7 C)-98.6 F (37 C)] 98.6 F (37 C) (12/20 0528) Pulse Rate:  [56-82] 61 (12/20 0528) Resp:  [12-21] 17 (12/20 0528) BP: (93-110)/(54-73) 96/56 (12/20 0528) SpO2:  [94 %-100 %] 97 % (12/20 0528) Weight:  [151 lb (68.5 kg)] 151 lb (68.5 kg) (12/19 1131)  Physical Exam:  General: alert, cooperative and no distress Lochia: appropriate Uterine Fundus: firm Incision: healing well DVT Evaluation: No evidence of DVT seen on physical exam.  Recent Labs    01/28/17 0945 01/30/17 0531  HGB 12.8 9.6*  HCT 38.0 27.2*    Assessment/Plan: Status post Cesarean section. Doing well postoperatively.  Continue current care.  Roselle LocusJames E Emberli Ballester II 01/30/2017, 7:44 AM

## 2017-01-30 NOTE — Lactation Note (Signed)
This note was copied from a baby's chart. Lactation Consultation Note Experienced BF mom BF her other children for 1 yr. Her 1st child she BF and formula fed, but the others she exclusively BF. Mom has small breast tissue, small areolas, and large everted nipples. Very wide space between breast. Offered DEBP for stimulation. Mom very sleepy at this time. She will ask RN for one if desired. Mom doesn't have any questions at this time. Encouraged to call for assistance or needs. [facility] brochure given w/resources, support groups and LC services. Patient Name: Jenna KhatBoy Merit Potts ZOXWR'UToday's Date: 01/30/2017 Reason for consult: Initial assessment   Maternal Data Has patient been taught Hand Expression?: Yes Does the patient have breastfeeding experience prior to this delivery?: Yes  Feeding Feeding Type: Breast Fed  LATCH Score Latch: Repeated attempts needed to sustain latch, nipple held in mouth throughout feeding, stimulation needed to elicit sucking reflex.  Audible Swallowing: None  Type of Nipple: Everted at rest and after stimulation  Comfort (Breast/Nipple): Soft / non-tender  Hold (Positioning): No assistance needed to correctly position infant at breast.  LATCH Score: 7  Interventions Interventions: Breast feeding basics reviewed  Lactation Tools Discussed/Used     Consult Status Consult Status: Follow-up Date: 01/31/17 Follow-up type: In-patient    Celestino Ackerman, Diamond NickelLAURA G 01/30/2017, 2:24 AM

## 2017-01-31 NOTE — Progress Notes (Signed)
Subjective: Postpartum Day 2: Cesarean Delivery Patient reports tolerating PO, + flatus and no problems voiding.    Objective: Vital signs in last 24 hours: Temp:  [98.3 F (36.8 C)] 98.3 F (36.8 C) (12/21 0454) Pulse Rate:  [69-80] 80 (12/21 0454) Resp:  [18-20] 18 (12/21 0454) BP: (100-108)/(53-67) 108/67 (12/21 0454) SpO2:  [94 %] 94 % (12/21 0454)  Physical Exam:  General: alert, cooperative and appears stated age Lochia: appropriate Uterine Fundus: firm Incision: healing well, no significant drainage, no dehiscence, no significant erythema DVT Evaluation: No evidence of DVT seen on physical exam. Negative Homan's sign. No cords or calf tenderness. No significant calf/ankle edema.  Recent Labs    01/28/17 0945 01/30/17 0531  HGB 12.8 9.6*  HCT 38.0 27.2*    Assessment/Plan: Status post Cesarean section. Doing well postoperatively.  Continue current care.  Ranae Pilalise Jennifer Datron Brakebill 01/31/2017, 8:35 AM

## 2017-01-31 NOTE — Lactation Note (Addendum)
This note was copied from a baby's chart. Lactation Consultation Note  Patient Name: Jenna Potts WGNFA'OToday's Date: 01/31/2017 Reason for consult: Follow-up assessment;Term;Infant weight loss;Other (Comment)(8 % weight loss, milk coming in, Bili check 33 - 6.2 )  Baby is 48 hours,  LC reviewed doc flow sheets and updated per mom  Per mom milk is coming in and breast are fuller.  Sore nipple and engorgement prevention and tx reviewed.  LC instructed mom on the use of shells, hand pump.  Reusable ice packs in case mom needs them tonight.  LC discussed nutritive vs non - nutritive feeding patterns , and to watch for hanging out .  Mother informed of post-discharge support and given phone number to the lactation department, including services for phone call assistance; out-patient appointments; and breastfeeding support group. List of other breastfeeding resources in the community given in the handout. Encouraged mother to call for problems or concerns related to breastfeeding.   Maternal Data    Feeding Feeding Type: (last fed at 12 N ) Length of feed: 25 min(per mom , increased swallows, milk coming in )  [facility]                   Interventions Interventions: Breast feeding basics reviewed;Hand pump;Shells  Lactation Tools Discussed/Used Tools: Shells;Pump Shell Type: Inverted Breast pump type: Manual WIC Program: No Pump Review: Setup, frequency, and cleaning Initiated by:: MAI  Date initiated:: 01/31/17   Consult Status Consult Status: PRN Date: 02/01/17 Follow-up type: In-patient    Jenna Potts 01/31/2017, 1:27 PM

## 2017-02-01 MED ORDER — OXYCODONE HCL 5 MG PO TABS: 5.0000 mg | ORAL_TABLET | ORAL | 0 refills | Status: AC | PRN

## 2017-02-01 NOTE — Discharge Summary (Signed)
Obstetric Discharge Summary Reason for Admission: cesarean section Prenatal Procedures: none Intrapartum Procedures: cesarean: low cervical, transverse Postpartum Procedures: none Complications-Operative and Postpartum: none Hemoglobin  Date Value Ref Range Status  01/30/2017 9.6 (L) 12.0 - 15.0 g/dL Final    Comment:    DELTA CHECK NOTED REPEATED TO VERIFY    HCT  Date Value Ref Range Status  01/30/2017 27.2 (L) 36.0 - 46.0 % Final    Physical Exam:  General: alert, cooperative and appears stated age 89Lochia: appropriate Uterine Fundus: firm Incision: healing well, no significant drainage, no dehiscence, no significant erythema DVT Evaluation: No evidence of DVT seen on physical exam. Negative Homan's sign. No cords or calf tenderness. No significant calf/ankle edema.  Discharge Diagnoses: Term Pregnancy-delivered  Discharge Information: Date: 02/01/2017 Activity: pelvic rest Diet: routine Medications: oxycodone Condition: stable Instructions: refer to practice specific booklet Discharge to: home   Newborn Data: Live born female  Birth Weight: 9 lb 2.4 oz (4150 g) APGAR: 8, 9  Newborn Delivery   Birth date/time:  01/29/2017 13:12:00 Delivery type:  C-Section, Low Transverse C-section categorization:  Repeat     Home with mother.  Ranae Pilalise Jennifer Ansen Sayegh 02/01/2017, 8:53 AM

## 2017-02-01 NOTE — Lactation Note (Signed)
This note was copied from a baby's chart. Lactation Consultation Note  Patient Name: Jenna Potts ZOXWR'UToday's Date: 02/01/2017 Reason for consult: Follow-up assessment;Term;Infant weight loss(weight today 8.5.2 oz , )  Baby is 67 hours old, 9% weight loss LC reviewed and updated the doc flow sheets per mom.  Voids and stools correlate with weight loss  Per mom  Milk is in and denies engorgement.  LC reviewed sore nipple and engorgement  prevention and tx yesterday.  And mom has a Hand pump/ shells for D/C and a DEBP at home.  LC stressed the importance of STS feedings due to the potential sluggishness with jaundice.  Since this is moms 4 th baby, recommended if the breast are overly full, express off to comfort  And feed on the 1st breast to soften well , and offer the 2nd breast so the baby will consistently  Obtain the creamy fatty milk to enhance weight gain.  Mother informed of post-discharge support and given phone number to the lactation department, including services for phone call assistance; out-patient appointments; and breastfeeding support group. List of other breastfeeding resources in the community given in the handout. Encouraged mother to call for problems or concerns related to breastfeeding.    Maternal Data    Feeding Feeding Type: Breast Fed Length of feed: 30 min  LATCH Score ( this Latch score is from the [facility] )  Latch: Grasps breast easily, tongue down, lips flanged, rhythmical sucking.  Audible Swallowing: Spontaneous and intermittent  Type of Nipple: Everted at rest and after stimulation  Comfort (Breast/Nipple): Soft / non-tender  Hold (Positioning): No assistance needed to correctly position infant at breast.  LATCH Score: 10  Interventions Interventions: Breast feeding basics reviewed  Lactation Tools Discussed/Used     Consult Status Consult Status: Complete Date: 02/01/17    Kathrin GreathouseMargaret Ann Linsie Lupo 02/01/2017, 8:13 AM

## 2017-02-13 DIAGNOSIS — J069 Acute upper respiratory infection, unspecified: Secondary | ICD-10-CM | POA: Diagnosis not present

## 2017-03-13 DIAGNOSIS — Z1389 Encounter for screening for other disorder: Secondary | ICD-10-CM | POA: Diagnosis not present

## 2017-04-23 DIAGNOSIS — N760 Acute vaginitis: Secondary | ICD-10-CM | POA: Diagnosis not present

## 2017-04-23 DIAGNOSIS — N941 Unspecified dyspareunia: Secondary | ICD-10-CM | POA: Diagnosis not present

## 2017-07-09 DIAGNOSIS — M545 Low back pain: Secondary | ICD-10-CM | POA: Diagnosis not present

## 2018-04-01 ENCOUNTER — Other Ambulatory Visit: Payer: Self-pay

## 2018-04-01 ENCOUNTER — Emergency Department (HOSPITAL_COMMUNITY)
Admission: EM | Admit: 2018-04-01 | Discharge: 2018-04-01 | Disposition: A | Discharge status: Home / Self Care | Payer: BLUE CROSS/BLUE SHIELD | Attending: Emergency Medicine | Admitting: Emergency Medicine

## 2018-04-01 ENCOUNTER — Emergency Department (HOSPITAL_COMMUNITY)
Admission: EM | Admit: 2018-04-01 | Discharge: 2018-04-02 | Disposition: A | Discharge status: Home / Self Care | Payer: BLUE CROSS/BLUE SHIELD | Source: Home / Self Care | Attending: Emergency Medicine | Admitting: Emergency Medicine

## 2018-04-01 ENCOUNTER — Encounter (HOSPITAL_COMMUNITY): Payer: Self-pay

## 2018-04-01 ENCOUNTER — Emergency Department (HOSPITAL_COMMUNITY): Payer: BLUE CROSS/BLUE SHIELD

## 2018-04-01 ENCOUNTER — Encounter (HOSPITAL_COMMUNITY): Payer: Self-pay | Admitting: Emergency Medicine

## 2018-04-01 DIAGNOSIS — K8050 Calculus of bile duct without cholangitis or cholecystitis without obstruction: Secondary | ICD-10-CM | POA: Diagnosis not present

## 2018-04-01 DIAGNOSIS — Z79899 Other long term (current) drug therapy: Secondary | ICD-10-CM

## 2018-04-01 DIAGNOSIS — Z5321 Procedure and treatment not carried out due to patient leaving prior to being seen by health care provider: Secondary | ICD-10-CM | POA: Insufficient documentation

## 2018-04-01 DIAGNOSIS — R1013 Epigastric pain: Secondary | ICD-10-CM | POA: Insufficient documentation

## 2018-04-01 DIAGNOSIS — R079 Chest pain, unspecified: Secondary | ICD-10-CM | POA: Diagnosis not present

## 2018-04-01 LAB — COMPREHENSIVE METABOLIC PANEL
ALK PHOS: 40 U/L (ref 38–126)
ALT: 39 U/L (ref 0–44)
ANION GAP: 8 (ref 5–15)
AST: 88 U/L — ABNORMAL HIGH (ref 15–41)
Albumin: 4.8 g/dL (ref 3.5–5.0)
BUN: 17 mg/dL (ref 6–20)
CALCIUM: 8.9 mg/dL (ref 8.9–10.3)
CO2: 24 mmol/L (ref 22–32)
CREATININE: 0.96 mg/dL (ref 0.44–1.00)
Chloride: 105 mmol/L (ref 98–111)
Glucose, Bld: 139 mg/dL — ABNORMAL HIGH (ref 70–99)
Potassium: 3.6 mmol/L (ref 3.5–5.1)
Sodium: 137 mmol/L (ref 135–145)
TOTAL PROTEIN: 7.7 g/dL (ref 6.5–8.1)
Total Bilirubin: 0.6 mg/dL (ref 0.3–1.2)

## 2018-04-01 LAB — CBC
HCT: 41.1 % (ref 36.0–46.0)
Hemoglobin: 13.2 g/dL (ref 12.0–15.0)
MCH: 29.3 pg (ref 26.0–34.0)
MCHC: 32.1 g/dL (ref 30.0–36.0)
MCV: 91.1 fL (ref 80.0–100.0)
NRBC: 0 % (ref 0.0–0.2)
PLATELETS: 307 10*3/uL (ref 150–400)
RBC: 4.51 MIL/uL (ref 3.87–5.11)
RDW: 12.9 % (ref 11.5–15.5)
WBC: 7.7 10*3/uL (ref 4.0–10.5)

## 2018-04-01 LAB — I-STAT BETA HCG BLOOD, ED (MC, WL, AP ONLY): I-stat hCG, quantitative: <5 m[IU]/mL (ref <5)

## 2018-04-01 LAB — I-STAT TROPONIN, ED: Troponin i, poc: 0 ng/mL (ref 0.00–0.08)

## 2018-04-01 LAB — LIPASE, BLOOD: Lipase: 49 U/L (ref 11–51)

## 2018-04-01 MED ORDER — SODIUM CHLORIDE 0.9% FLUSH: 3.0000 mL | Freq: Once | INTRAVENOUS | Status: DC

## 2018-04-01 NOTE — ED Triage Notes (Signed)
Pt c/o centralized chest/epigastric pain that is currently dull and 1/10. Pt reports episode around 1300 where it was intense/squeezing and lasted around 45 minutes. Pt reports similar episode on Friday night. Pt reports pain comes and goes in intensity.

## 2018-04-01 NOTE — ED Notes (Signed)
Pt came from [facility] ED, pt had CBC, BMP, and I-stat beta hcg resulting at 1500. RN confirmed with charge, don't have to re-order at this time.

## 2018-04-01 NOTE — ED Triage Notes (Signed)
Pt c/o epigastric pain that started around 1pm today. Denies n/v/d or urinary problems.

## 2018-04-02 NOTE — ED Notes (Signed)
ED Provider at bedside. 

## 2018-04-02 NOTE — ED Provider Notes (Signed)
[facility] EMERGENCY DEPARTMENT Provider Note  CSN: [id number] Arrival date & time: 04/01/18 2007  Chief Complaint(s) Abdominal Pain  HPI Jenna Potts is a 38 y.o. female with a past medical history listed below who presents to the emergency department with 2 episodes of upper abdominal cramping.  Last episode was approximately 1 PM earlier today.  Reports that she ate lunch around noon.  Pain lasted approximately 45 minutes and subsided on its own.  She denied any associated nausea or vomiting.  No recent fevers or infections.  She denied any overt chest pain or shortness of breath.  No diarrhea.  No suspicious food intake.  Patient is unsure of history of cholelithiasis but reports that she has a family history of it.  Patient went to [facility] initially and had blood work drawn.  Left prior to being seen to come to [facility].  HPI  Past Medical History Past Medical History:  Diagnosis Date  . Anemia 2017   with current pregnancy  . Complication of anesthesia   . Dysrhythmia 2003   low K+ due to dehydration after sports  . Heartburn during pregnancy   . Hx of varicella   . No pertinent past medical history   . PONV (postoperative nausea and vomiting)   . Spondylolisthesis    Patient Active Problem List   Diagnosis Date Noted  . S/P cesarean section 01/29/2017  . S/P repeat low transverse C-section 04/14/2015   Home Medication(s) Prior to Admission medications   Medication Sig Start Date End Date Taking? Authorizing Provider  ferrous sulfate 325 (65 FE) MG EC tablet Take 1 tablet (325 mg total) by mouth 2 (two) times daily. Patient not taking: Reported on 01/22/2017 04/16/15 01/22/17  Standard, Venus, CNM  oxyCODONE (OXY IR/ROXICODONE) 5 MG immediate release tablet Take 1 tablet (5 mg total) by mouth every 4 (four) hours as needed for severe pain. 02/01/17   Ranae Pila, MD  Prenatal Vit-Fe Fumarate-FA (PRENATAL MULTIVITAMIN) TABS tablet  Take 1 tablet by mouth at bedtime.    [provider]                                                                                                                                    Past Surgical History Past Surgical History:  Procedure Laterality Date  . CESAREAN SECTION  2009  . CESAREAN SECTION  10/19/2010   Procedure: CESAREAN SECTION;  Surgeon: Mickel Baas;  Location: [facility];  Service: Gynecology;  Laterality: N/A;  . CESAREAN SECTION N/A 04/14/2015   Procedure: CESAREAN SECTION;  Surgeon: Osborn Coho, MD;  Location: [facility];  Service: Obstetrics;  Laterality: N/A;  . CESAREAN SECTION WITH BILATERAL TUBAL LIGATION Bilateral 01/29/2017   Procedure: REPEAT CESAREAN SECTION WITH BILATERAL TUBAL LIGATION;  Surgeon: Mitchel Honour, DO;  Location: [facility];  Service: Obstetrics;  Laterality: Bilateral;  Repeat edc 02/02/17 nkda need RNFA  .  WISDOM TOOTH EXTRACTION     Family History Family History  Problem Relation Age of Onset  . Hypertension Mother   . Prostate cancer Father   . Hypertension Sister   . Prostate cancer Paternal Grandfather     Social History Social History   Tobacco Use  . Smoking status: Never Smoker  . Smokeless tobacco: Never Used  Substance Use Topics  . Alcohol use: No  . Drug use: No   Allergies Patient has no known allergies.  Review of Systems Review of Systems All other systems are reviewed and are negative for acute change except as noted in the HPI  Physical Exam Vital Signs  I have reviewed the triage vital signs BP 117/78   Pulse 62   Temp 97.8 F (36.6 C) (Oral)   Resp 18   LMP 03/06/2018   SpO2 99%   Physical Exam Vitals signs reviewed.  Constitutional:      General: She is not in acute distress.    Appearance: She is well-developed. She is not diaphoretic.  HENT:     Head: Normocephalic and atraumatic.     Right Ear: External ear normal.     Left Ear: External ear normal.     Nose: Nose normal.    Eyes:     General: No scleral icterus.       Right eye: No discharge.        Left eye: No discharge.     Conjunctiva/sclera: Conjunctivae normal.     Pupils: Pupils are equal, round, and reactive to light.  Neck:     Musculoskeletal: Normal range of motion and neck supple.     Trachea: Phonation normal.  Cardiovascular:     Rate and Rhythm: Normal rate and regular rhythm.     Heart sounds: No murmur. No friction rub. No gallop.   Pulmonary:     Effort: Pulmonary effort is normal. No respiratory distress.     Breath sounds: Normal breath sounds. No stridor. No rales.  Abdominal:     General: There is no distension.     Palpations: Abdomen is soft.     Tenderness: There is no abdominal tenderness.  Musculoskeletal: Normal range of motion.        General: No tenderness.  Skin:    General: Skin is warm and dry.     Findings: No erythema or rash.  Neurological:     Mental Status: She is alert and oriented to person, place, and time.  Psychiatric:        Behavior: Behavior normal.     ED Results and Treatments Labs (all labs ordered are listed, but only abnormal results are displayed) Labs Reviewed  I-STAT TROPONIN, ED                                                                                                                         EKG  EKG Interpretation  Date/Time:  Wednesday April 01 2018 20:20:56 EST Ventricular Rate:  61 PR Interval:  150 QRS Duration: 86 QT Interval:  402 QTC Calculation: 404 R Axis:   79 Text Interpretation:  Normal sinus rhythm Normal ECG No significant change since last tracing Confirmed by Drema Pryardama,  [phone]([id number]) on 04/02/2018 12:56:40 AM      Radiology Dg Chest 2 View  Result Date: 04/01/2018 CLINICAL DATA:  38 year old female with recent sharp chest pain. EXAM: CHEST - 2 VIEW COMPARISON:  None. FINDINGS: Normal cardiac size and mediastinal contours. Lung volumes at the upper limits of normal. Both lungs appear clear. No pneumothorax  or pleural effusion. No osseous abnormality identified. Negative visible bowel gas pattern. IMPRESSION: Negative.  No acute cardiopulmonary abnormality. Electronically Signed   By: Odessa FlemingH  Hall M.D.   On: 04/01/2018 20:52   Pertinent labs & imaging results that were available during my care of the patient were reviewed by me and considered in my medical decision making (see chart for details).  Medications Ordered in ED Medications  sodium chloride flush (NS) 0.9 % injection 3 mL (has no administration in time range)                                                                                                                                    Procedures Procedures  EMERGENCY DEPARTMENT BILIARY ULTRASOUND INTERPRETATION "Study: Limited Abdominal Ultrasound of the Gallbladder and Common Bile Duct."  INDICATIONS: Abdominal pain Indication: Multiple views of the gallbladder and common bile duct were obtained in real-time with a Multi-frequency probe."  PERFORMED BY:  Myself IMAGES ARCHIVED?: Yes LIMITATIONS: None INTERPRETATION: Cholelithiasis, Gallbladder wall normal in thickness, Sonographic Murphy's sign abscent and Common bile duct normal in size   (including critical care time)  Medical Decision Making / ED Course I have reviewed the nursing notes for this encounter and the patient's prior records (if available in EHR or on provided paperwork).    Patient presents with epigastric pain, now resolved.  [facility] without leukocytosis or anemia.  No significant electrolyte derangements or renal sufficiency.  Patient had mildly elevated AST otherwise no evidence of biliary obstruction or pancreatitis.  During the triage process here, she had a work-up for possible chest pain with a reassuring EKG and troponin.  I do not feel that her symptoms are related to cardiac etiology.  Feel that this is most consistent with biliary colic.  Her abdomen at this time is benign.   Her labs are reassuring.  I performed a bedside ultrasound which did reveal a large (2 cm) stone and numerous smaller stones near the neck of the gallbladder.  There was no evidence to suggest acute cholecystitis at this time.  She is tolerating oral intake.  Doubt other serious intra-abdominal Fama to assess infectious process requiring additional work-up or imaging.  Feel she is appropriate for outpatient management.  Will refer her to surgery.    Final Clinical Impression(s) / ED Diagnoses Final diagnoses:  Biliary colic   Disposition: Discharge  Condition: Good  I have discussed the results, Dx and Tx plan with the patient who expressed understanding and agree(s) with the plan. Discharge instructions discussed at great length. The patient was given strict return precautions who verbalized understanding of the instructions. No further questions at time of discharge.    ED Discharge Orders    None       Follow Up: [facility] [address] [place] [address] [phone]  Schedule an appointment as soon as possible for a visit  As needed  Kinsinger, De Blanch, MD [address] [place] [address] [place] [address] [phone]  Schedule an appointment as soon as possible for a visit  For close follow up to assess for biliary colic      This chart was dictated using voice recognition software.  Despite best efforts to proofread,  errors can occur which can change the documentation meaning.   Nira Conn, MD 04/02/18 [phone]

## 2018-04-07 ENCOUNTER — Ambulatory Visit: Payer: Self-pay | Admitting: Surgery

## 2018-04-07 DIAGNOSIS — K8010 Calculus of gallbladder with chronic cholecystitis without obstruction: Secondary | ICD-10-CM | POA: Diagnosis not present

## 2018-04-07 NOTE — H&P (Signed)
History of Present Illness Jenna Potts. Sarabi Sockwell MD; 04/07/2018 4:46 PM) The patient is a 38 year old female who presents for evaluation of gall stones. Referred by Dr. Eudelia Bunch for biliary colic PCP - [facility]  This is a healthy 38 year old female who presents with 2 recent episodes of severe epigastric and upper abdominal pain. Both these are exacerbated by eating. The most recent was very severe and she presented to the emergency department for evaluation. Bedside ultrasound showed multiple gallstones but no sign of cholecystitis. White blood cell count was normal. Liver function tests showed a mildly elevated AST but normal bilirubin. Lipase was normal. The patient has had no further symptoms but has been watching her diet very carefully. She presents today for evaluation for possible cholecystectomy.   Past Surgical History Santiago Glad, [place]; 04/07/2018 4:13 PM) Cesarean Section - Multiple  Diagnostic Studies History Santiago Glad, [place]; 04/07/2018 4:13 PM) Colonoscopy never Mammogram never Pap Smear 1-5 years ago  Allergies Santiago Glad, CMA; 04/07/2018 4:14 PM) No Known Drug Allergies [04/07/2018]: Allergies Reconciled  Medication History Santiago Glad, [place]; 04/07/2018 4:14 PM) Medications Reconciled  Social History Santiago Glad, [place]; 04/07/2018 4:13 PM) Alcohol use Occasional alcohol use. Caffeine use Coffee, Tea. No drug use Tobacco use Never smoker.  Family History Santiago Glad, [place]; 04/07/2018 4:13 PM) Prostate Cancer Father.  Pregnancy / Birth History Santiago Glad, [place]; 04/07/2018 4:13 PM) Age at menarche 17 years. Contraceptive History Oral contraceptives. Gravida 4 Length (months) of breastfeeding 7-12 Maternal age 72-25 Para 4 Regular periods  Other Problems Santiago Glad, CMA; 04/07/2018 4:13 PM) Cholelithiasis Gastroesophageal Reflux Disease Hemorrhoids     Review of Systems Santiago Glad CMA;  04/07/2018 4:13 PM) General Not Present- Appetite Loss, Chills, Fatigue, Fever, Night Sweats, Weight Gain and Weight Loss. Skin Not Present- Change in Wart/Mole, Dryness, Hives, Jaundice, New Lesions, Non-Healing Wounds, Rash and Ulcer. HEENT Not Present- Earache, Hearing Loss, Hoarseness, Nose Bleed, Oral Ulcers, Ringing in the Ears, Seasonal Allergies, Sinus Pain, Sore Throat, Visual Disturbances, Wears glasses/contact lenses and Yellow Eyes. Respiratory Not Present- Bloody sputum, Chronic Cough, Difficulty Breathing, Snoring and Wheezing. Breast Not Present- Breast Mass, Breast Pain, Nipple Discharge and Skin Changes. Cardiovascular Not Present- Chest Pain, Difficulty Breathing Lying Down, Leg Cramps, Palpitations, Rapid Heart Rate, Shortness of Breath and Swelling of Extremities. Gastrointestinal Present- Abdominal Pain. Not Present- Bloating, Bloody Stool, Change in Bowel Habits, Chronic diarrhea, Constipation, Difficulty Swallowing, Excessive gas, Gets full quickly at meals, Hemorrhoids, Indigestion, Nausea, Rectal Pain and Vomiting. Female Genitourinary Not Present- Frequency, Nocturia, Painful Urination, Pelvic Pain and Urgency. Musculoskeletal Not Present- Back Pain, Joint Pain, Joint Stiffness, Muscle Pain, Muscle Weakness and Swelling of Extremities. Neurological Not Present- Decreased Memory, Fainting, Headaches, Numbness, Seizures, Tingling, Tremor, Trouble walking and Weakness. Psychiatric Not Present- Anxiety, Bipolar, Change in Sleep Pattern, Depression, Fearful and Frequent crying. Endocrine Not Present- Cold Intolerance, Excessive Hunger, Hair Changes, Heat Intolerance, Hot flashes and New Diabetes. Hematology Not Present- Blood Thinners, Easy Bruising, Excessive bleeding, Gland problems, HIV and Persistent Infections.  Vitals Santiago Glad CMA; 04/07/2018 4:14 PM) 04/07/2018 4:13 PM Weight: 123.2 lb Height: 62in Body Surface Area: 1.56 m Body Mass Index: 22.53 kg/m   Temp.: 97.2F  Pulse: 73 (Regular)  BP: 106/74 (Sitting, Left Arm, Standard)      Physical Exam Molli Hazard K. Alayah Knouff MD; 04/07/2018 4:46 PM)  The physical exam findings are as follows: Note:WDWN in NAD Eyes: Pupils equal, round; sclera anicteric HENT: Oral mucosa moist; good dentition Neck: No masses palpated, no  thyromegaly Lungs: CTA bilaterally; normal respiratory effort CV: Regular rate and rhythm; no murmurs; extremities well-perfused with no edema Abd: +bowel sounds, soft, non-tender, no palpable organomegaly; no palpable hernias Skin: Warm, dry; no sign of jaundice Psychiatric - alert and oriented x 4; calm mood and affect    Assessment & Plan Molli Hazard K. Jourdin Gens MD; 04/07/2018 4:39 PM)  CHRONIC CHOLECYSTITIS WITH CALCULUS (K80.10)  Current Plans Schedule for Surgery - Laparoscopic cholecystectomy with intraoperative cholangiogram. The surgical procedure has been discussed with the patient. Potential risks, benefits, alternative treatments, and expected outcomes have been explained. All of the patient's questions at this time have been answered. The likelihood of reaching the patient's treatment goal is good. The patient understand the proposed surgical procedure and wishes to proceed.  Jenna Potts. Corliss Skains, MD, [facility]  General/ Trauma Surgery Beeper [phone]  04/07/2018 4:46 PM

## 2018-04-21 DIAGNOSIS — K8000 Calculus of gallbladder with acute cholecystitis without obstruction: Secondary | ICD-10-CM | POA: Diagnosis not present

## 2018-04-21 DIAGNOSIS — Z0000 Encounter for general adult medical examination without abnormal findings: Secondary | ICD-10-CM | POA: Diagnosis not present

## 2018-04-23 DIAGNOSIS — K8020 Calculus of gallbladder without cholecystitis without obstruction: Secondary | ICD-10-CM | POA: Diagnosis not present

## 2018-04-23 DIAGNOSIS — N946 Dysmenorrhea, unspecified: Secondary | ICD-10-CM | POA: Diagnosis not present

## 2018-04-23 DIAGNOSIS — E282 Polycystic ovarian syndrome: Secondary | ICD-10-CM | POA: Diagnosis not present

## 2018-04-23 DIAGNOSIS — R5383 Other fatigue: Secondary | ICD-10-CM | POA: Diagnosis not present

## 2018-05-29 DIAGNOSIS — E559 Vitamin D deficiency, unspecified: Secondary | ICD-10-CM | POA: Diagnosis not present

## 2018-05-29 DIAGNOSIS — R790 Abnormal level of blood mineral: Secondary | ICD-10-CM | POA: Diagnosis not present

## 2018-05-29 DIAGNOSIS — E538 Deficiency of other specified B group vitamins: Secondary | ICD-10-CM | POA: Diagnosis not present

## 2018-05-29 DIAGNOSIS — R7989 Other specified abnormal findings of blood chemistry: Secondary | ICD-10-CM | POA: Diagnosis not present

## 2018-06-26 DIAGNOSIS — N760 Acute vaginitis: Secondary | ICD-10-CM | POA: Diagnosis not present

## 2018-08-07 DIAGNOSIS — R7989 Other specified abnormal findings of blood chemistry: Secondary | ICD-10-CM | POA: Diagnosis not present

## 2018-08-07 DIAGNOSIS — E538 Deficiency of other specified B group vitamins: Secondary | ICD-10-CM | POA: Diagnosis not present

## 2018-08-07 DIAGNOSIS — R790 Abnormal level of blood mineral: Secondary | ICD-10-CM | POA: Diagnosis not present

## 2018-08-07 DIAGNOSIS — E039 Hypothyroidism, unspecified: Secondary | ICD-10-CM | POA: Diagnosis not present

## 2018-08-07 DIAGNOSIS — E559 Vitamin D deficiency, unspecified: Secondary | ICD-10-CM | POA: Diagnosis not present

## 2018-08-07 DIAGNOSIS — R748 Abnormal levels of other serum enzymes: Secondary | ICD-10-CM | POA: Diagnosis not present

## 2018-09-04 DIAGNOSIS — N946 Dysmenorrhea, unspecified: Secondary | ICD-10-CM | POA: Diagnosis not present

## 2018-09-04 DIAGNOSIS — E559 Vitamin D deficiency, unspecified: Secondary | ICD-10-CM | POA: Diagnosis not present

## 2018-09-04 DIAGNOSIS — R748 Abnormal levels of other serum enzymes: Secondary | ICD-10-CM | POA: Diagnosis not present

## 2018-09-04 DIAGNOSIS — R5383 Other fatigue: Secondary | ICD-10-CM | POA: Diagnosis not present

## 2018-09-04 DIAGNOSIS — E538 Deficiency of other specified B group vitamins: Secondary | ICD-10-CM | POA: Diagnosis not present

## 2018-09-18 ENCOUNTER — Encounter: Payer: Self-pay | Admitting: Internal Medicine

## 2018-10-03 DIAGNOSIS — Z20828 Contact with and (suspected) exposure to other viral communicable diseases: Secondary | ICD-10-CM | POA: Diagnosis not present

## 2018-10-14 DIAGNOSIS — K9041 Non-celiac gluten sensitivity: Secondary | ICD-10-CM | POA: Diagnosis not present

## 2018-10-16 ENCOUNTER — Ambulatory Visit: Payer: BLUE CROSS/BLUE SHIELD | Admitting: Internal Medicine

## 2019-08-21 IMAGING — DX DG CHEST 2V
2 series · 2 of 2 positions shown · non-contrast
Comparison: None.

CLINICAL DATA: 37-year-old female with recent sharp chest pain.

EXAM:
CHEST - 2 VIEW

[w chest pa]
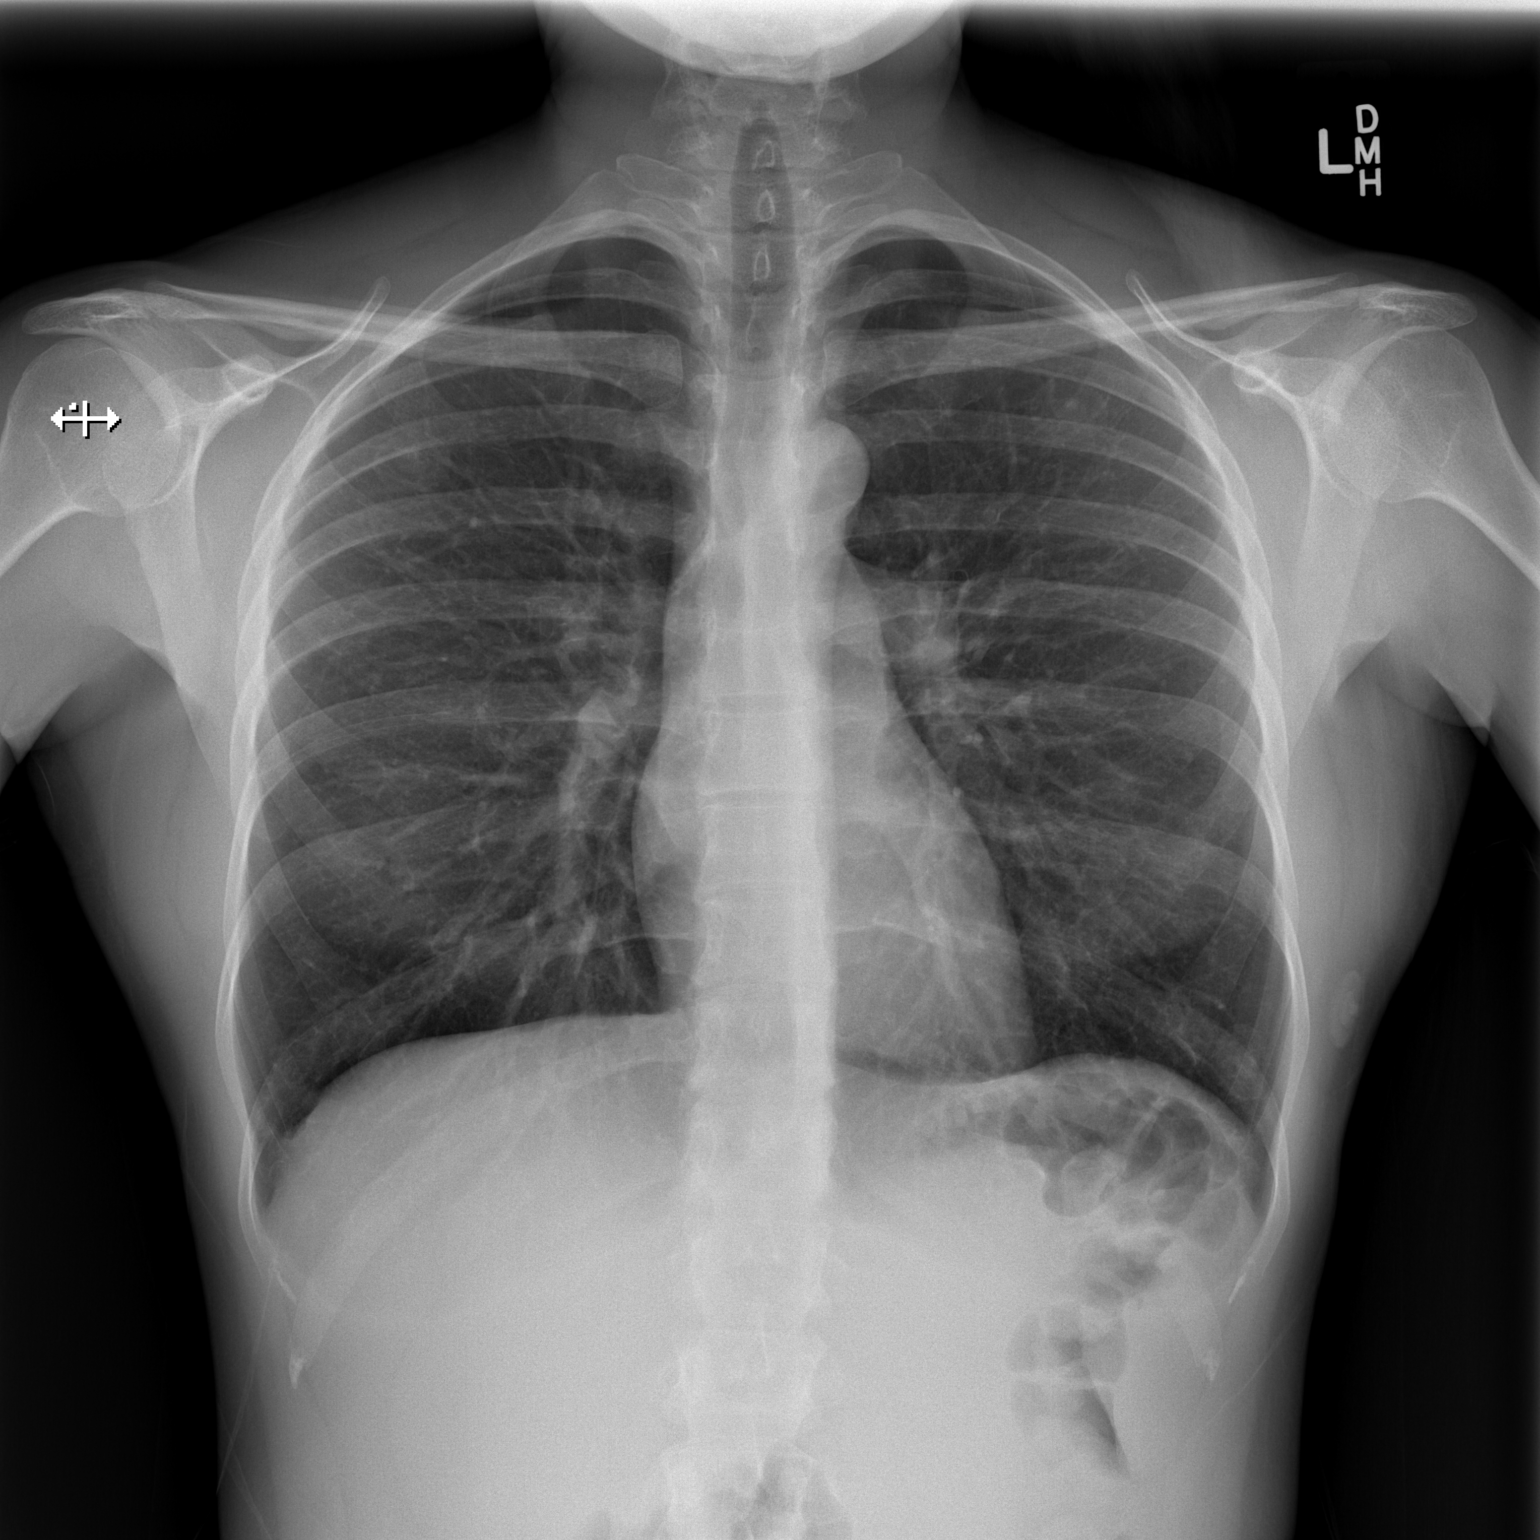

[w chest lat]
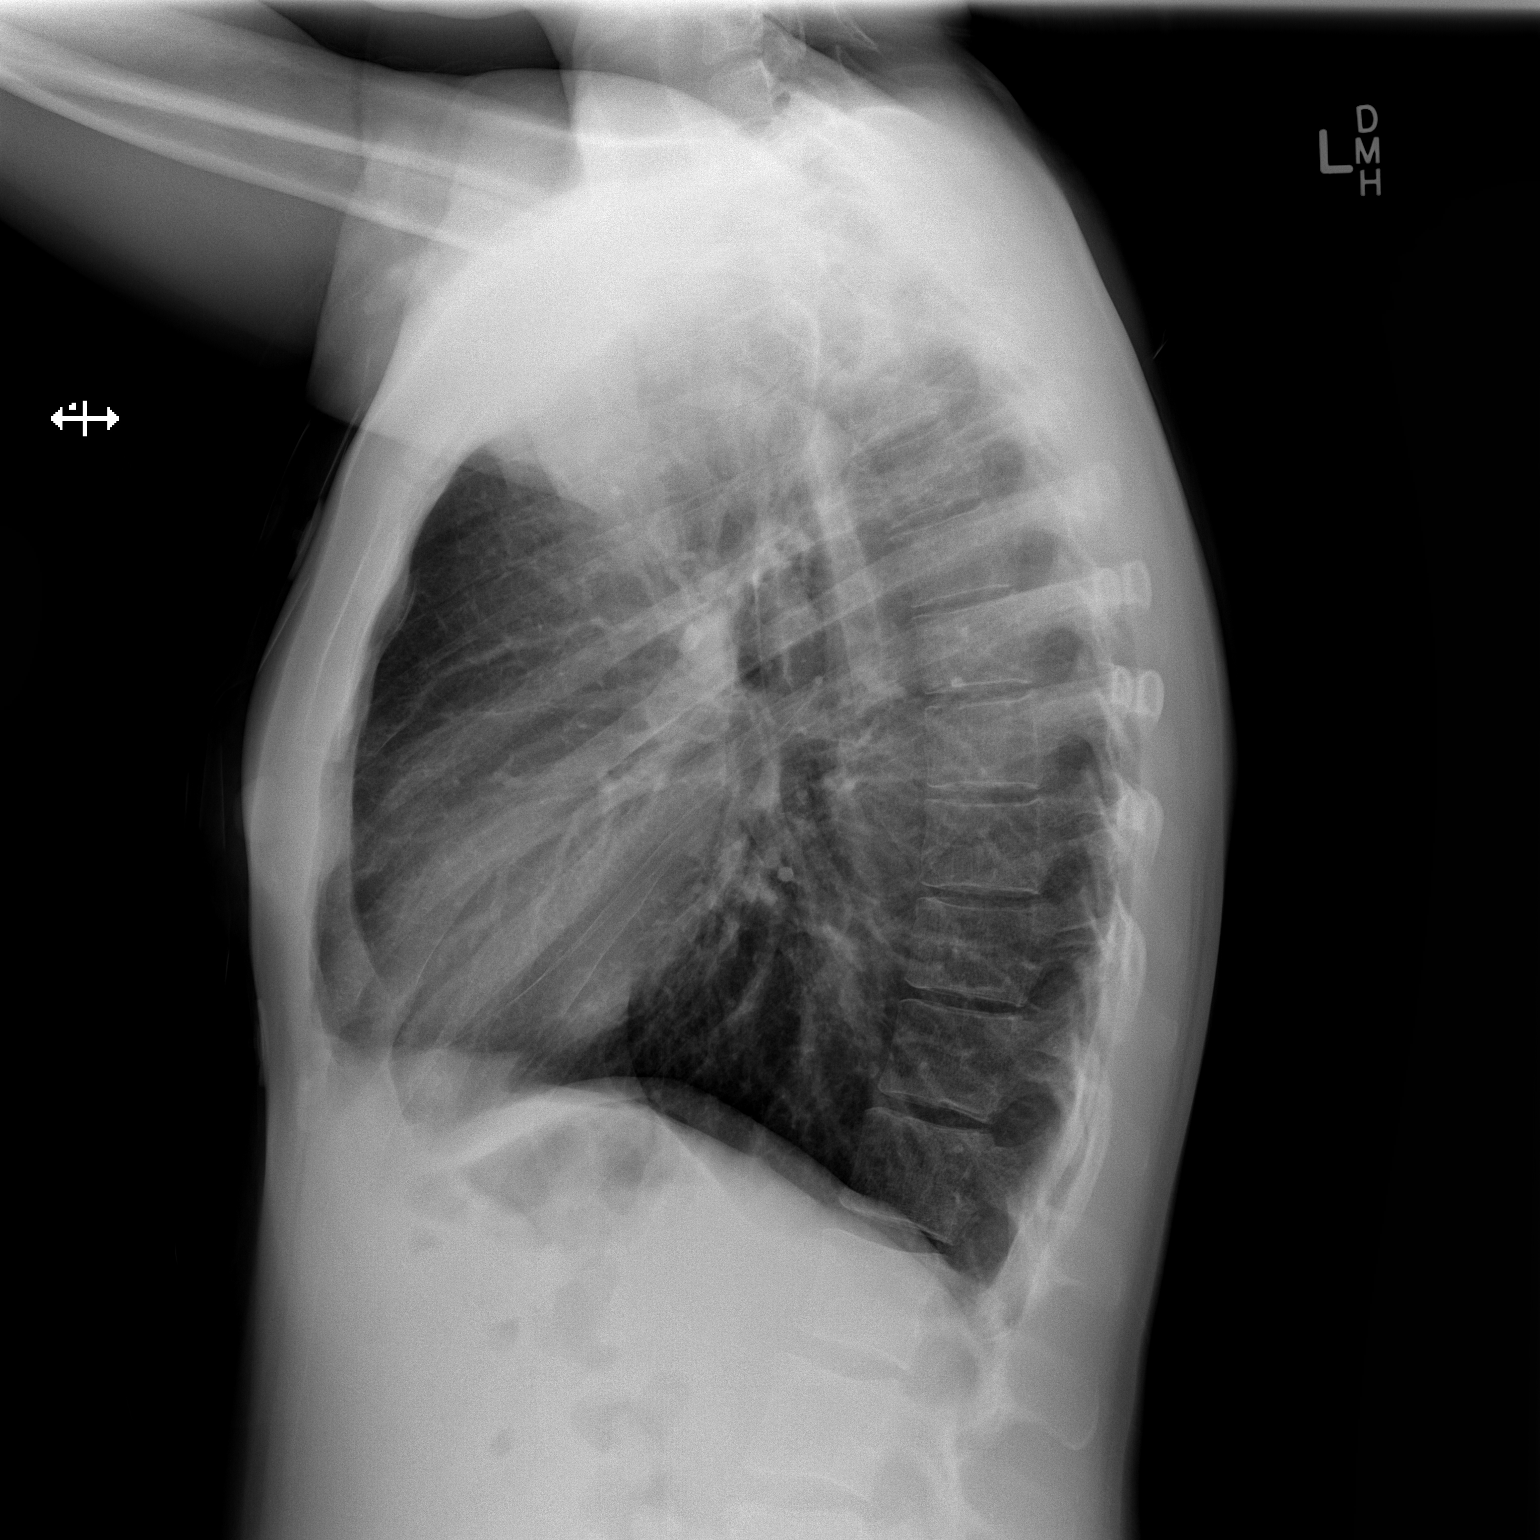

[2 of 2 positions shown; findings below may reference images not displayed]

FINDINGS: Normal cardiac size and mediastinal contours. Lung volumes at the
upper limits of normal. Both lungs appear clear. No pneumothorax or
pleural effusion.

No osseous abnormality identified. Negative visible bowel gas
pattern.
IMPRESSION: Negative.  No acute cardiopulmonary abnormality.
